# Patient Record
Sex: Female | Born: 1975 | Race: White | Hispanic: No | Marital: Married | Smoking: Never smoker
Health system: Southern US, Community
[De-identification: ages and names within clinical notes are randomized; demographics above are authoritative.]

## PROBLEM LIST (undated history)

## (undated) DIAGNOSIS — O09529 Supervision of elderly multigravida, unspecified trimester: Secondary | ICD-10-CM

## (undated) DIAGNOSIS — Z8619 Personal history of other infectious and parasitic diseases: Secondary | ICD-10-CM

## (undated) DIAGNOSIS — J302 Other seasonal allergic rhinitis: Secondary | ICD-10-CM

## (undated) DIAGNOSIS — E785 Hyperlipidemia, unspecified: Secondary | ICD-10-CM

## (undated) HISTORY — DX: Personal history of other infectious and parasitic diseases: Z86.19

## (undated) HISTORY — PX: AUGMENTATION MAMMAPLASTY: SUR837

## (undated) HISTORY — DX: Supervision of elderly multigravida, unspecified trimester: O09.529

## (undated) HISTORY — PX: DILATION AND CURETTAGE OF UTERUS: SHX78

---

## 1998-07-07 ENCOUNTER — Encounter: Admission: RE | Admit: 1998-07-07 | Discharge: 1998-07-07 | Payer: Self-pay | Admitting: *Deleted

## 1999-08-11 ENCOUNTER — Other Ambulatory Visit: Admission: RE | Admit: 1999-08-11 | Discharge: 1999-08-11 | Payer: Self-pay | Admitting: Obstetrics and Gynecology

## 1999-10-20 ENCOUNTER — Other Ambulatory Visit: Admission: RE | Admit: 1999-10-20 | Discharge: 1999-10-20 | Payer: Self-pay | Admitting: Obstetrics and Gynecology

## 2000-01-27 ENCOUNTER — Other Ambulatory Visit: Admission: RE | Admit: 2000-01-27 | Discharge: 2000-01-27 | Payer: Self-pay | Admitting: Obstetrics and Gynecology

## 2000-04-23 ENCOUNTER — Other Ambulatory Visit: Admission: RE | Admit: 2000-04-23 | Discharge: 2000-04-23 | Payer: Self-pay | Admitting: Obstetrics and Gynecology

## 2001-05-06 ENCOUNTER — Other Ambulatory Visit: Admission: RE | Admit: 2001-05-06 | Discharge: 2001-05-06 | Payer: Self-pay | Admitting: Obstetrics and Gynecology

## 2002-01-22 ENCOUNTER — Inpatient Hospital Stay (HOSPITAL_COMMUNITY): Admission: AD | Admit: 2002-01-22 | Discharge: 2002-01-25 | Payer: Self-pay | Admitting: Obstetrics & Gynecology

## 2002-05-29 ENCOUNTER — Other Ambulatory Visit: Admission: RE | Admit: 2002-05-29 | Discharge: 2002-05-29 | Payer: Self-pay | Admitting: Obstetrics and Gynecology

## 2003-06-30 ENCOUNTER — Other Ambulatory Visit: Admission: RE | Admit: 2003-06-30 | Discharge: 2003-06-30 | Payer: Self-pay | Admitting: Obstetrics and Gynecology

## 2004-07-05 ENCOUNTER — Other Ambulatory Visit: Admission: RE | Admit: 2004-07-05 | Discharge: 2004-07-05 | Payer: Self-pay | Admitting: Obstetrics and Gynecology

## 2005-02-03 ENCOUNTER — Ambulatory Visit (HOSPITAL_COMMUNITY): Admission: RE | Admit: 2005-02-03 | Discharge: 2005-02-03 | Payer: Self-pay | Admitting: Obstetrics and Gynecology

## 2005-12-22 ENCOUNTER — Inpatient Hospital Stay (HOSPITAL_COMMUNITY): Admission: AD | Admit: 2005-12-22 | Discharge: 2005-12-24 | Payer: Self-pay | Admitting: Obstetrics and Gynecology

## 2011-03-23 ENCOUNTER — Other Ambulatory Visit: Payer: Self-pay | Admitting: Obstetrics and Gynecology

## 2014-06-18 ENCOUNTER — Encounter: Payer: Self-pay | Admitting: Emergency Medicine

## 2014-06-18 ENCOUNTER — Emergency Department
Admission: EM | Admit: 2014-06-18 | Discharge: 2014-06-18 | Disposition: A | Discharge status: Home / Self Care | Payer: BC Managed Care – PPO | Source: Home / Self Care | Attending: Emergency Medicine | Admitting: Emergency Medicine

## 2014-06-18 DIAGNOSIS — N3000 Acute cystitis without hematuria: Secondary | ICD-10-CM

## 2014-06-18 HISTORY — DX: Hyperlipidemia, unspecified: E78.5

## 2014-06-18 HISTORY — DX: Other seasonal allergic rhinitis: J30.2

## 2014-06-18 LAB — POCT URINALYSIS DIP (MANUAL ENTRY)
Bilirubin, UA: NEGATIVE
Blood, UA: NEGATIVE
Glucose, UA: NEGATIVE
Ketones, POC UA: NEGATIVE
Nitrite, UA: NEGATIVE
Protein Ur, POC: NEGATIVE
Spec Grav, UA: 1.01 (ref 1.005–1.03)
Urobilinogen, UA: 0.2 (ref 0–1)
pH, UA: 6.5 (ref 5–8)

## 2014-06-18 MED ORDER — CIPROFLOXACIN HCL 500 MG PO TABS: 500.0000 mg | ORAL_TABLET | Freq: Two times a day (BID) | ORAL | Status: DC

## 2014-06-18 NOTE — ED Provider Notes (Signed)
CSN: [id number]     Arrival date & time 06/18/14  1734 History   First MD Initiated Contact with Patient 06/18/14 1736     Chief Complaint  Patient presents with  . Dysuria    HPI This is a 38 y.o. female who presents today with UTI symptoms for 4 days.  + dysuria + frequency + urgency No hematuria No vaginal discharge No fever/chills No lower abdominal pain No nausea No vomiting No back pain No fatigue She denies chance of pregnancy.--- On Jearld AdjutantMarina IUD Has tried over-the-counter measures, such as Azo, without improvement.   Past Medical History  Diagnosis Date  . Seasonal allergies   . Hyperlipidemia    History reviewed. No pertinent past surgical history. History reviewed. No pertinent family history. History  Substance Use Topics  . Smoking status: Never Smoker   . Smokeless tobacco: Not on file  . Alcohol Use: Yes   OB History   Grav Para Term Preterm Abortions TAB SAB Ect Mult Living                 Review of Systems  All other systems reviewed and are negative.   Allergies  Review of patient's allergies indicates no known allergies.  Home Medications   Prior to Admission medications   Medication Sig Start Date End Date Taking? Authorizing Provider  fexofenadine (ALLEGRA) 180 MG tablet Take 180 mg by mouth daily.   Yes Historical Provider, MD  levonorgestrel (MIRENA) 20 MCG/24HR IUD 1 each by Intrauterine route once.   Yes Historical Provider, MD  ciprofloxacin (CIPRO) 500 MG tablet Take 1 tablet (500 mg total) by mouth 2 (two) times daily. For 7 days 06/18/14   Lajean Manesavid Massey, MD   BP 113/69  Pulse 66  Temp(Src) 98.4 F (36.9 C) (Oral)  Resp 16  Ht 5\' 4"  (1.626 m)  Wt 140 lb (63.504 kg)  BMI 24.02 kg/m2  SpO2 100% Physical Exam  Nursing note and vitals reviewed. Constitutional: She is oriented to person, place, and time. She appears well-developed and well-nourished. No distress.  HENT:  Mouth/Throat: Oropharynx is clear and moist.  Eyes: No  scleral icterus.  Neck: Neck supple.  Cardiovascular: Normal rate, regular rhythm and normal heart sounds.   Pulmonary/Chest: Breath sounds normal.  Abdominal: Soft. She exhibits no mass. There is no hepatosplenomegaly. There is tenderness in the suprapubic area. There is no rebound, no guarding and no CVA tenderness.  Lymphadenopathy:    She has no cervical adenopathy.  Neurological: She is alert and oriented to person, place, and time.  Skin: Skin is warm and dry.    ED Course  Procedures (including critical care time) Labs Review Labs Reviewed  POCT URINALYSIS DIP (MANUAL ENTRY)    Imaging Review No results found.   MDM   1. Acute cystitis without hematuria     urinalysis + for leukocytes  Treatment options discussed, as well as risks, benefits, alternatives. Patient voiced understanding and agreement with the following plans: Urine culture Cipro 500 twice a day x7 days Other symptomatic care device discussed  Follow-up with your primary care doctor in 5-7 days if not improving, or sooner if symptoms become worse. Precautions discussed. Red flags discussed. Questions invited and answered. Patient voiced understanding and agreement.    Lajean Manesavid Massey, MD 06/18/14 [phone]

## 2014-06-18 NOTE — ED Notes (Signed)
Has experienced varying degrees of dysuria x 4 days; took AZO earlier in week; has just returned from vacation in [place] 5 days ago.

## 2014-06-20 LAB — URINE CULTURE: Colony Count: >=100000

## 2014-06-25 ENCOUNTER — Telehealth: Payer: Self-pay

## 2014-06-25 NOTE — ED Notes (Signed)
Left detailed message advising patient to finish antibiotic and to follow up with PCP.

## 2014-09-01 ENCOUNTER — Other Ambulatory Visit: Payer: Self-pay | Admitting: Obstetrics and Gynecology

## 2014-09-02 LAB — CYTOLOGY - PAP

## 2015-09-15 ENCOUNTER — Other Ambulatory Visit: Payer: Self-pay | Admitting: Obstetrics and Gynecology

## 2015-09-15 LAB — OB RESULTS CONSOLE HEPATITIS B SURFACE ANTIGEN: HEP B S AG: NEGATIVE

## 2015-09-15 LAB — OB RESULTS CONSOLE GC/CHLAMYDIA
Chlamydia: NEGATIVE
Gonorrhea: NEGATIVE

## 2015-09-15 LAB — OB RESULTS CONSOLE ABO/RH: RH TYPE: POSITIVE

## 2015-09-15 LAB — OB RESULTS CONSOLE RPR: RPR: NONREACTIVE

## 2015-09-15 LAB — OB RESULTS CONSOLE ANTIBODY SCREEN: ANTIBODY SCREEN: NEGATIVE

## 2015-09-15 LAB — OB RESULTS CONSOLE HIV ANTIBODY (ROUTINE TESTING): HIV: NONREACTIVE

## 2015-09-15 LAB — OB RESULTS CONSOLE RUBELLA ANTIBODY, IGM: Rubella: IMMUNE

## 2015-09-16 LAB — CYTOLOGY - PAP

## 2015-11-14 NOTE — L&D Delivery Note (Signed)
Delivery Note  Patient pushed for one hour after she was noted to be C/C/+2. At 5:36 PM a viable and healthy female was delivered via Vaginal, Spontaneous Delivery (Presentation: ; Right Occiput Posterior).  APGAR: 8, 9; weight pending. Baby laid on maternal abdomen after birth.  Placenta was manually delivered as cord avulsed and it was noted to be adherent to the anterior uterine wall. A uterine sweep was performed after removal, however will get a pelvic US to ensure no remnant remains.  Cord: 3 vessels with the following complications: None.  Minimal bleeding.  Second degree laceration repaired in routine fashion with 2-0 Vicryl and 3-0 chromic  Anesthesia: Epidural  Episiotomy:   Lacerations: 2nd degree Suture Repair: 2.0 vicryl Est. Blood Loss (mL):  300  Mom to postpartum.  Baby to Couplet care / Skin to Skin.  Nancy Lucas, Nancy Lucas 03/31/2016, 6:22 PM

## 2016-03-09 ENCOUNTER — Other Ambulatory Visit: Payer: Self-pay | Admitting: Obstetrics and Gynecology

## 2016-03-09 LAB — OB RESULTS CONSOLE GBS: GBS: POSITIVE

## 2016-03-17 ENCOUNTER — Other Ambulatory Visit: Payer: Self-pay | Admitting: Obstetrics and Gynecology

## 2016-03-27 ENCOUNTER — Telehealth (HOSPITAL_COMMUNITY): Payer: Self-pay | Admitting: *Deleted

## 2016-03-27 ENCOUNTER — Encounter (HOSPITAL_COMMUNITY): Payer: Self-pay | Admitting: *Deleted

## 2016-03-27 NOTE — Telephone Encounter (Signed)
Preadmission screen  

## 2016-03-31 ENCOUNTER — Inpatient Hospital Stay (HOSPITAL_COMMUNITY): Payer: BLUE CROSS/BLUE SHIELD | Admitting: Anesthesiology

## 2016-03-31 ENCOUNTER — Encounter (HOSPITAL_COMMUNITY): Payer: Self-pay

## 2016-03-31 ENCOUNTER — Inpatient Hospital Stay (HOSPITAL_COMMUNITY)
Admission: RE | Admit: 2016-03-31 | Discharge: 2016-04-02 | DRG: 775 | Disposition: A | Discharge status: Home / Self Care | Payer: BLUE CROSS/BLUE SHIELD | Source: Ambulatory Visit | Attending: Obstetrics & Gynecology | Admitting: Obstetrics & Gynecology

## 2016-03-31 ENCOUNTER — Inpatient Hospital Stay (HOSPITAL_COMMUNITY): Payer: BLUE CROSS/BLUE SHIELD

## 2016-03-31 DIAGNOSIS — O99824 Streptococcus B carrier state complicating childbirth: Secondary | ICD-10-CM | POA: Diagnosis present

## 2016-03-31 DIAGNOSIS — Z3A39 39 weeks gestation of pregnancy: Secondary | ICD-10-CM

## 2016-03-31 DIAGNOSIS — Z3490 Encounter for supervision of normal pregnancy, unspecified, unspecified trimester: Secondary | ICD-10-CM

## 2016-03-31 DIAGNOSIS — E669 Obesity, unspecified: Secondary | ICD-10-CM | POA: Diagnosis present

## 2016-03-31 DIAGNOSIS — Z6831 Body mass index (BMI) 31.0-31.9, adult: Secondary | ICD-10-CM | POA: Diagnosis not present

## 2016-03-31 DIAGNOSIS — O99214 Obesity complicating childbirth: Secondary | ICD-10-CM | POA: Diagnosis present

## 2016-03-31 LAB — CBC
HCT: 26.4 % — ABNORMAL LOW (ref 36.0–46.0)
HEMOGLOBIN: 8.5 g/dL — AB (ref 12.0–15.0)
MCH: 27.2 pg (ref 26.0–34.0)
MCHC: 32.2 g/dL (ref 30.0–36.0)
MCV: 84.3 fL (ref 78.0–100.0)
Platelets: 210 10*3/uL (ref 150–400)
RBC: 3.13 MIL/uL — AB (ref 3.87–5.11)
RDW: 13.7 % (ref 11.5–15.5)
WBC: 8.6 10*3/uL (ref 4.0–10.5)

## 2016-03-31 LAB — TYPE AND SCREEN
ABO/RH(D): AB POS
ANTIBODY SCREEN: NEGATIVE

## 2016-03-31 LAB — ABO/RH: ABO/RH(D): AB POS

## 2016-03-31 MED ORDER — FENTANYL 2.5 MCG/ML BUPIVACAINE 1/10 % EPIDURAL INFUSION (WH - ANES)
14.0000 mL/h | INTRAMUSCULAR | Status: DC | PRN
  Administered 2016-03-31 (×2): 14 mL/h via EPIDURAL
  Filled 2016-03-31 (×2): qty 125

## 2016-03-31 MED ORDER — ONDANSETRON HCL 4 MG PO TABS: 4.0000 mg | ORAL_TABLET | ORAL | Status: DC | PRN

## 2016-03-31 MED ORDER — DIPHENHYDRAMINE HCL 25 MG PO CAPS: 25.0000 mg | ORAL_CAPSULE | Freq: Four times a day (QID) | ORAL | Status: DC | PRN

## 2016-03-31 MED ORDER — OXYTOCIN 40 UNITS IN LACTATED RINGERS INFUSION - SIMPLE MED: 2.5000 [IU]/h | INTRAVENOUS | Status: DC | PRN

## 2016-03-31 MED ORDER — SODIUM CHLORIDE 0.9 % IV SOLN
3.0000 g | Freq: Once | INTRAVENOUS | Status: AC
  Administered 2016-03-31: 3 g via INTRAVENOUS
  Filled 2016-03-31: qty 3

## 2016-03-31 MED ORDER — TERBUTALINE SULFATE 1 MG/ML IJ SOLN
0.2500 mg | Freq: Once | INTRAMUSCULAR | Status: DC | PRN
  Filled 2016-03-31: qty 1

## 2016-03-31 MED ORDER — ONDANSETRON HCL 4 MG/2ML IJ SOLN
4.0000 mg | Freq: Four times a day (QID) | INTRAMUSCULAR | Status: DC | PRN
  Administered 2016-03-31: 4 mg via INTRAVENOUS
  Filled 2016-03-31: qty 2

## 2016-03-31 MED ORDER — IBUPROFEN 600 MG PO TABS
600.0000 mg | ORAL_TABLET | Freq: Four times a day (QID) | ORAL | Status: DC
  Administered 2016-03-31 – 2016-04-02 (×7): 600 mg via ORAL
  Filled 2016-03-31 (×7): qty 1

## 2016-03-31 MED ORDER — OXYTOCIN 40 UNITS IN LACTATED RINGERS INFUSION - SIMPLE MED
2.5000 [IU]/h | INTRAVENOUS | Status: DC
  Filled 2016-03-31: qty 1000

## 2016-03-31 MED ORDER — OXYTOCIN 40 UNITS IN LACTATED RINGERS INFUSION - SIMPLE MED
1.0000 m[IU]/min | INTRAVENOUS | Status: DC
  Administered 2016-03-31: 2 m[IU]/min via INTRAVENOUS

## 2016-03-31 MED ORDER — DIPHENHYDRAMINE HCL 50 MG/ML IJ SOLN: 12.5000 mg | INTRAMUSCULAR | Status: DC | PRN

## 2016-03-31 MED ORDER — PHENYLEPHRINE 40 MCG/ML (10ML) SYRINGE FOR IV PUSH (FOR BLOOD PRESSURE SUPPORT)
80.0000 ug | PREFILLED_SYRINGE | INTRAVENOUS | Status: DC | PRN
  Filled 2016-03-31: qty 10
  Filled 2016-03-31: qty 5

## 2016-03-31 MED ORDER — ACETAMINOPHEN 325 MG PO TABS: 650.0000 mg | ORAL_TABLET | ORAL | Status: DC | PRN

## 2016-03-31 MED ORDER — OXYTOCIN BOLUS FROM INFUSION
500.0000 mL | INTRAVENOUS | Status: DC
  Administered 2016-03-31: 500 mL via INTRAVENOUS

## 2016-03-31 MED ORDER — EPHEDRINE 5 MG/ML INJ
10.0000 mg | INTRAVENOUS | Status: DC | PRN
  Filled 2016-03-31: qty 2

## 2016-03-31 MED ORDER — ACETAMINOPHEN 325 MG PO TABS
650.0000 mg | ORAL_TABLET | ORAL | Status: DC | PRN
  Administered 2016-04-01: 650 mg via ORAL
  Filled 2016-03-31: qty 2

## 2016-03-31 MED ORDER — SENNOSIDES-DOCUSATE SODIUM 8.6-50 MG PO TABS
2.0000 | ORAL_TABLET | ORAL | Status: DC
  Administered 2016-04-01 (×2): 2 via ORAL
  Filled 2016-03-31 (×2): qty 2

## 2016-03-31 MED ORDER — ZOLPIDEM TARTRATE 5 MG PO TABS: 5.0000 mg | ORAL_TABLET | Freq: Every evening | ORAL | Status: DC | PRN

## 2016-03-31 MED ORDER — WITCH HAZEL-GLYCERIN EX PADS: 1.0000 "application " | MEDICATED_PAD | CUTANEOUS | Status: DC | PRN

## 2016-03-31 MED ORDER — TETANUS-DIPHTH-ACELL PERTUSSIS 5-2.5-18.5 LF-MCG/0.5 IM SUSP: 0.5000 mL | Freq: Once | INTRAMUSCULAR | Status: DC

## 2016-03-31 MED ORDER — BENZOCAINE-MENTHOL 20-0.5 % EX AERO
1.0000 "application " | INHALATION_SPRAY | CUTANEOUS | Status: DC | PRN
  Filled 2016-03-31: qty 56

## 2016-03-31 MED ORDER — MISOPROSTOL 25 MCG QUARTER TABLET
25.0000 ug | ORAL_TABLET | ORAL | Status: DC | PRN
  Administered 2016-03-31: 25 ug via VAGINAL
  Filled 2016-03-31: qty 1
  Filled 2016-03-31 (×3): qty 0.25

## 2016-03-31 MED ORDER — PENICILLIN G POTASSIUM 5000000 UNITS IJ SOLR
5.0000 10*6.[IU] | Freq: Once | INTRAVENOUS | Status: AC
  Administered 2016-03-31: 5 10*6.[IU] via INTRAVENOUS
  Filled 2016-03-31: qty 5

## 2016-03-31 MED ORDER — LACTATED RINGERS IV SOLN: 500.0000 mL | Freq: Once | INTRAVENOUS | Status: DC

## 2016-03-31 MED ORDER — OXYCODONE-ACETAMINOPHEN 5-325 MG PO TABS: 2.0000 | ORAL_TABLET | ORAL | Status: DC | PRN

## 2016-03-31 MED ORDER — CITRIC ACID-SODIUM CITRATE 334-500 MG/5ML PO SOLN: 30.0000 mL | ORAL | Status: DC | PRN

## 2016-03-31 MED ORDER — LACTATED RINGERS IV SOLN
500.0000 mL | INTRAVENOUS | Status: DC | PRN
  Administered 2016-03-31: 500 mL via INTRAVENOUS

## 2016-03-31 MED ORDER — ONDANSETRON HCL 4 MG/2ML IJ SOLN: 4.0000 mg | INTRAMUSCULAR | Status: DC | PRN

## 2016-03-31 MED ORDER — OXYCODONE-ACETAMINOPHEN 5-325 MG PO TABS: 1.0000 | ORAL_TABLET | ORAL | Status: DC | PRN

## 2016-03-31 MED ORDER — LIDOCAINE HCL (PF) 1 % IJ SOLN
INTRAMUSCULAR | Status: DC | PRN
  Administered 2016-03-31 (×2): 4 mL

## 2016-03-31 MED ORDER — PENICILLIN G POTASSIUM 5000000 UNITS IJ SOLR
2.5000 10*6.[IU] | INTRAVENOUS | Status: DC
  Administered 2016-03-31 (×2): 2.5 10*6.[IU] via INTRAVENOUS
  Filled 2016-03-31 (×9): qty 2.5

## 2016-03-31 MED ORDER — PRENATAL MULTIVITAMIN CH
1.0000 | ORAL_TABLET | Freq: Every day | ORAL | Status: DC
  Administered 2016-04-01: 1 via ORAL
  Filled 2016-03-31: qty 1

## 2016-03-31 MED ORDER — COCONUT OIL OIL: 1.0000 "application " | TOPICAL_OIL | Status: DC | PRN

## 2016-03-31 MED ORDER — SIMETHICONE 80 MG PO CHEW: 80.0000 mg | CHEWABLE_TABLET | ORAL | Status: DC | PRN

## 2016-03-31 MED ORDER — LIDOCAINE HCL (PF) 1 % IJ SOLN
30.0000 mL | INTRAMUSCULAR | Status: DC | PRN
  Filled 2016-03-31: qty 30

## 2016-03-31 MED ORDER — LACTATED RINGERS IV SOLN
INTRAVENOUS | Status: DC
  Administered 2016-03-31: 125 mL/h via INTRAVENOUS
  Administered 2016-03-31: 01:00:00 via INTRAVENOUS

## 2016-03-31 MED ORDER — PHENYLEPHRINE 40 MCG/ML (10ML) SYRINGE FOR IV PUSH (FOR BLOOD PRESSURE SUPPORT)
80.0000 ug | PREFILLED_SYRINGE | INTRAVENOUS | Status: DC | PRN
  Filled 2016-03-31: qty 5

## 2016-03-31 MED ORDER — DIBUCAINE 1 % RE OINT
1.0000 "application " | TOPICAL_OINTMENT | RECTAL | Status: DC | PRN
  Filled 2016-03-31: qty 28

## 2016-03-31 NOTE — Anesthesia Preprocedure Evaluation (Signed)

## 2016-03-31 NOTE — Anesthesia Pain Management Evaluation Note (Signed)
  CRNA Pain Management Visit Note  Patient: Nancy Lucas, 40 y.o., female  "Hello I am a member of the anesthesia team at [facility]. We have an anesthesia team available at all times to provide care throughout the hospital, including epidural management and anesthesia for C-section. I don't know your plan for the delivery whether it a natural birth, water birth, IV sedation, nitrous supplementation, doula or epidural, but we want to meet your pain goals."   1.Was your pain managed to your expectations on prior hospitalizations?   Yes   2.What is your expectation for pain management during this hospitalization?     Epidural  3.How can we help you reach that goal? epidural  Record the patient's initial score and the patient's pain goal.   Pain: 0  Pain Goal: 4 [facility] wants you to be able to say your pain was always managed very well.  Vickee Mormino 03/31/2016

## 2016-03-31 NOTE — Progress Notes (Signed)
Juluis MireCherie Victory is a 40 y.o. [id number] at 3767w0d by LMP admitted for induction of labor due to Elective at term.  Subjective: Patient comfortable overall with epidural.  She is having some back pain Objective: BP 141/89 mmHg  Pulse 73  Temp(Src) 97.9 F (36.6 C) (Oral)  Resp 16  Ht 5\' 4"  (1.626 m)  Wt 81.647 kg (180 lb)  BMI 30.88 kg/m2  SpO2 99%  LMP 07/02/2015      FHT:  FHR: 145 bpm, variability: moderate,  accelerations:  Present,  decelerations:  Absent UC:   irregular, every 4-5 minutes SVE:   Dilation: 4 Effacement (%): 80 Station: -1 Exam by:: Dr, Mora ApplPinn  Labs: Lab Results  Component Value Date   WBC 8.6 03/31/2016   HGB 8.5* 03/31/2016   HCT 26.4* 03/31/2016   MCV 84.3 03/31/2016   PLT 210 03/31/2016    Assessment / Plan: Induction of labor due to AMA elective at term,  progressing well on pitocin  Labor: AROM performed clear moderate Preeclampsia:  no signs or symptoms of toxicity Fetal Wellbeing:  Category I Pain Control:  Epidural I/D:  n/a Anticipated MOD:  NSVD  Prarthana Parlin [name] 03/31/2016, 12:36 PM

## 2016-03-31 NOTE — H&P (Signed)
Nancy Lucas is a 40 y.o. female G4P2 AMA presenting for Elective IOL at term. Maternal Medical History:  Reason for admission: Contractions.  Nausea. Elective IOL at term  Contractions: Onset was 1-2 hours ago.   Frequency: irregular.   Duration is approximately 60 seconds.   Perceived severity is mild.    Fetal activity: Perceived fetal activity is normal.   Last perceived fetal movement was within the past 12 hours.    Prenatal complications: no prenatal complications   OB History    Gravida Para Term Preterm AB TAB SAB Ectopic Multiple Living   Past Medical History  Diagnosis Date  . Seasonal allergies   . Hyperlipidemia   . Hx of varicella   . AMA (advanced maternal age) multigravida 35+    Past Surgical History  Procedure Laterality Date  . Dilation and curettage of uterus     Family History: family history includes Hyperlipidemia in her maternal grandfather and mother. Social History:  reports that she has never smoked. She has never used smokeless tobacco. She reports that she drinks alcohol. She reports that she does not use illicit drugs.   Prenatal Transfer Tool  Maternal Diabetes: No Genetic Screening: Normal Maternal Ultrasounds/Referrals: Normal Fetal Ultrasounds or other Referrals:  Other: Anatomy scan normal Maternal Substance Abuse:  No Significant Maternal Medications:  None Significant Maternal Lab Results:  Lab values include: Group B Strep positive Other Comments:  None  Review of Systems  Constitutional: Negative for fever and chills.  HENT: Negative for hearing loss.   Eyes: Negative for blurred vision and double vision.  Respiratory: Negative for cough.   Cardiovascular: Negative for chest pain and palpitations.  Gastrointestinal: Positive for heartburn. Negative for nausea and vomiting.  Genitourinary: Negative for dysuria and urgency.  Musculoskeletal: Negative for myalgias.  Skin: Negative for itching and rash.   Neurological: Negative for headaches.  Endo/Heme/Allergies: Negative for environmental allergies. Does not bruise/bleed easily.  Psychiatric/Behavioral: Negative for depression and suicidal ideas.  All other systems reviewed and are negative.   Dilation: 2 Effacement (%): Thick Station: -3 Exam by:: D Jasso, RN Blood pressure 125/71, pulse 74, temperature 98.2 F (36.8 C), temperature source Oral, resp. rate 16, height  (1.626 m), weight 81.647 kg (180 lb), last menstrual period 07/02/2015. Maternal Exam:  Uterine Assessment: Contraction strength is mild.  Contraction duration is 60 seconds. Contraction frequency is irregular.   Abdomen: Patient reports no abdominal tenderness. Fundal height is 39 cm.   Estimated fetal weight is 2900 grams.   Fetal presentation: vertex  Introitus: Normal vulva. Normal vagina.  Ferning test: not done.  Nitrazine test: not done. Amniotic fluid character: not assessed.  Pelvis: adequate for delivery.   Cervix: Cervix evaluated by digital exam.   1-2 /50/-3  Fetal Exam Fetal Monitor Review: Mode: hand-held doppler probe.   Baseline rate: 135.  Variability: moderate (6-25 bpm).   Pattern: accelerations present and no decelerations.    Fetal State Assessment: Category I - tracings are normal.     Physical Exam  Vitals reviewed. Constitutional: She is oriented to person, place, and time. She appears well-developed and well-nourished.  HENT:  Head: Normocephalic and atraumatic.  Neck: Normal range of motion.  Cardiovascular: Normal rate and regular rhythm.   Respiratory: Effort normal.  GI: Soft.  Genitourinary: Vagina normal.  Musculoskeletal: Normal range of motion.  Neurological: She is alert and oriented to person, place, and  time. She has normal reflexes.  Skin: Skin is warm.    Prenatal labs: ABO, Rh: --/--/AB POS, AB POS (05/19 0110) Antibody: NEG (05/19 0110) Rubella: Immune (11/02 0000) RPR: Nonreactive (11/02 0000)   HBsAg: Negative (11/02 0000)  HIV: Non-reactive (11/02 0000)  GBS: Positive (04/27 0000)   Assessment/Plan: 40 yo G4P2 at 39 weeks for elective IOL Misoprostol, then pitocin AROM Epidural on demand PCN for GBS status Continuous EFM / TOCO   Nancy Lucas Nancy Lucas 03/31/2016, 8:20 AM

## 2016-03-31 NOTE — Anesthesia Procedure Notes (Signed)
Epidural Patient location during procedure: OB  Staffing Anesthesiologist: Iya Hamed Performed by: anesthesiologist   Preanesthetic Checklist Completed: patient identified, site marked, surgical consent, pre-op evaluation, timeout performed, IV checked, risks and benefits discussed and monitors and equipment checked  Epidural Patient position: sitting Prep: site prepped and draped and DuraPrep Patient monitoring: continuous pulse ox and blood pressure Approach: midline Location: L3-L4 Injection technique: LOR saline  Needle:  Needle type: Tuohy  Needle gauge: 17 G Needle length: 9 cm and 9 Needle insertion depth: 6 cm Catheter type: closed end flexible Catheter size: 19 Gauge Catheter at skin depth: 10 cm Test dose: negative  Assessment Events: blood not aspirated, injection not painful, no injection resistance, negative IV test and no paresthesia  Additional Notes Patient identified. Risks/Benefits/Options discussed with patient including but not limited to bleeding, infection, nerve damage, paralysis, failed block, incomplete pain control, headache, blood pressure changes, nausea, vomiting, reactions to medication both or allergic, itching and postpartum back pain. Confirmed with bedside nurse the patient's most recent platelet count. Confirmed with patient that they are not currently taking any anticoagulation, have any bleeding history or any family history of bleeding disorders. Patient expressed understanding and wished to proceed. All questions were answered. Sterile technique was used throughout the entire procedure. Please see nursing notes for vital signs. Test dose was given through epidural catheter and negative prior to continuing to dose epidural or start infusion. Warning signs of high block given to the patient including shortness of breath, tingling/numbness in hands, complete motor block, or any concerning symptoms with instructions to call for help. Patient was  given instructions on fall risk and not to get out of bed. All questions and concerns addressed with instructions to call with any issues or inadequate analgesia.    

## 2016-03-31 NOTE — Progress Notes (Signed)
Nancy Lucas is a 40 y.o. [id number] at 4166w0d by LMP admitted for induction of labor due to Elective at term.  Subjective: Patient feeling more pressure  Objective: BP 132/73 mmHg  Pulse 77  Temp(Src) 97.9 F (36.6 C) (Oral)  Resp 16  Ht 5\' 4"  (1.626 m)  Wt 81.647 kg (180 lb)  BMI 30.88 kg/m2  SpO2 99%  LMP 07/02/2015   Total I/O In: -  Out: 725 [Urine:725]  FHT:  FHR: 130 bpm, variability: moderate,  accelerations:  Present,  decelerations:  Present early variables with ctx UC:   regular, every 1-2 minutes SVE:   Dilation: 9 Effacement (%): 100 Station: 0 Exam by:: MD Jama Mcmiller  Labs: Lab Results  Component Value Date   WBC 8.6 03/31/2016   HGB 8.5* 03/31/2016   HCT 26.4* 03/31/2016   MCV 84.3 03/31/2016   PLT 210 03/31/2016    Assessment / Plan: Induction of labor due to AMA at term,  progressing well on pitocin Now almost in second stage  Labor: Progressing normally Preeclampsia:  no signs or symptoms of toxicity Fetal Wellbeing:  Category I Pain Control:  Epidural I/D:  n/a Anticipated MOD:  NSVD  Yarelis Ambrosino [name] 03/31/2016, 3:11 PM

## 2016-04-01 ENCOUNTER — Inpatient Hospital Stay (HOSPITAL_COMMUNITY): Payer: BLUE CROSS/BLUE SHIELD | Admitting: Anesthesiology

## 2016-04-01 ENCOUNTER — Encounter (HOSPITAL_COMMUNITY)
Admission: RE | Disposition: A | Discharge status: Home / Self Care | Payer: Self-pay | Source: Ambulatory Visit | Attending: Obstetrics & Gynecology

## 2016-04-01 LAB — CBC
HEMATOCRIT: 24.4 % — AB (ref 36.0–46.0)
Hemoglobin: 7.9 g/dL — ABNORMAL LOW (ref 12.0–15.0)
MCH: 27.4 pg (ref 26.0–34.0)
MCHC: 32.4 g/dL (ref 30.0–36.0)
MCV: 84.7 fL (ref 78.0–100.0)
PLATELETS: 177 10*3/uL (ref 150–400)
RBC: 2.88 MIL/uL — AB (ref 3.87–5.11)
RDW: 13.4 % (ref 11.5–15.5)
WBC: 12.4 10*3/uL — ABNORMAL HIGH (ref 4.0–10.5)

## 2016-04-01 LAB — SURGICAL PCR SCREEN
MRSA, PCR: NEGATIVE
Staphylococcus aureus: POSITIVE — AB

## 2016-04-01 LAB — RPR: RPR Ser Ql: NONREACTIVE

## 2016-04-01 SURGERY — LIGATION, FALLOPIAN TUBE, POSTPARTUM
Anesthesia: Epidural | Laterality: Bilateral

## 2016-04-01 MED ORDER — LACTATED RINGERS IV SOLN
INTRAVENOUS | Status: DC
  Administered 2016-04-01: 20 mL/h via INTRAVENOUS

## 2016-04-01 MED ORDER — FAMOTIDINE 20 MG PO TABS
40.0000 mg | ORAL_TABLET | Freq: Once | ORAL | Status: AC
  Administered 2016-04-01: 40 mg via ORAL
  Filled 2016-04-01 (×2): qty 2

## 2016-04-01 MED ORDER — MUPIROCIN 2 % EX OINT
1.0000 | TOPICAL_OINTMENT | Freq: Two times a day (BID) | CUTANEOUS | Status: DC
Start: 2016-04-01 — End: 2016-04-02
  Administered 2016-04-01 – 2016-04-02 (×2): 1 via NASAL
  Filled 2016-04-01 (×2): qty 22

## 2016-04-01 MED ORDER — CHLORHEXIDINE GLUCONATE CLOTH 2 % EX PADS
6.0000 | MEDICATED_PAD | Freq: Every day | CUTANEOUS | Status: DC
Start: 2016-04-01 — End: 2016-04-02

## 2016-04-01 MED ORDER — METOCLOPRAMIDE HCL 10 MG PO TABS
10.0000 mg | ORAL_TABLET | Freq: Once | ORAL | Status: AC
  Administered 2016-04-01: 10 mg via ORAL
  Filled 2016-04-01: qty 1

## 2016-04-01 NOTE — Anesthesia Preprocedure Evaluation (Deleted)
Anesthesia Evaluation  Patient identified by MRN, date of birth, ID band Patient awake    Reviewed: Allergy & Precautions, NPO status , Patient's Chart, lab work & pertinent test results  History of Anesthesia Complications Negative for: history of anesthetic complications  Airway Mallampati: II  TM Distance: >3 FB Neck ROM: Full    Dental no notable dental hx. (+) Dental Advisory Given   Pulmonary neg pulmonary ROS,    Pulmonary exam normal breath sounds clear to auscultation       Cardiovascular negative cardio ROS Normal cardiovascular exam Rhythm:Regular Rate:Normal     Neuro/Psych negative neurological ROS  negative psych ROS   GI/Hepatic negative GI ROS, Neg liver ROS,   Endo/Other  obesity  Renal/GU negative Renal ROS  negative genitourinary   Musculoskeletal negative musculoskeletal ROS (+)   Abdominal   Peds negative pediatric ROS (+)  Hematology negative hematology ROS (+)   Anesthesia Other Findings   Reproductive/Obstetrics Desires sterilization                             Anesthesia Physical  Anesthesia Plan  ASA: II  Anesthesia Plan: Epidural   Post-op Pain Management:    Induction:   Airway Management Planned: Natural Airway and Nasal Cannula  Additional Equipment:   Intra-op Plan:   Post-operative Plan:   Informed Consent: I have reviewed the patients History and Physical, chart, labs and discussed the procedure including the risks, benefits and alternatives for the proposed anesthesia with the patient or authorized representative who has indicated his/her understanding and acceptance.   Dental advisory given  Plan Discussed with: CRNA, Surgeon and Anesthesiologist  Anesthesia Plan Comments: (Plan to use epidural in place for PPTL. Helmut MusterM. Hermon Zea, MD)        Anesthesia Quick Evaluation

## 2016-04-01 NOTE — Progress Notes (Signed)
Post Partum Day 1 Subjective: Patient with some cramping, no heavy bleeding.  Bottle feeding  Objective: Blood pressure 114/65, pulse 72, temperature 98.7 F (37.1 C), temperature source Oral, resp. rate 16, height 5\' 4"  (1.626 m), weight 81.647 kg (180 lb), last menstrual period 07/02/2015, SpO2 99 %,   Physical Exam:  General: alert, cooperative and no distress Lochia: appropriate Uterine Fundus: firm perineum: healing well, no significant drainage, no dehiscence DVT Evaluation: No evidence of DVT seen on physical exam. Negative Homan's sign. No cords or calf tenderness. Calf/Ankle edema is present.   Recent Labs  03/31/16 0110 04/01/16 0519  HGB 8.5* 7.9*  HCT 26.4* 24.4*    Assessment/Plan: Patient was scheduled for Post partum tubal, however, with her Hb being Low decision was made to cancel it and discussed with patient doing an interval tubal.  Pelvic US was negative last night so D&C not needed.  Will watch her overnight and d/c in am Circumcision for baby boy this am   LOS: 1 day   Nancy Lucas [name] 04/01/2016, 7:42 AM

## 2016-04-01 NOTE — Anesthesia Postprocedure Evaluation (Signed)
Anesthesia Post Note  Patient: Nancy Lucas  Procedure(s) Performed: * No procedures listed *  Patient location during evaluation: Mother Baby Anesthesia Type: Epidural Level of consciousness: oriented and awake and alert Pain management: pain level controlled Vital Signs Assessment: post-procedure vital signs reviewed and stable Respiratory status: spontaneous breathing Cardiovascular status: stable Postop Assessment: epidural receding, patient able to bend at knees, no signs of nausea or vomiting and adequate PO intake Anesthetic complications: no     Last Vitals:  Filed Vitals:   04/01/16 0010 04/01/16 0545  BP: 132/69 114/65  Pulse: 84 72  Temp: 37.2 C 37.1 C  Resp: 16 16    Last Pain:  Filed Vitals:   04/01/16 0605  PainSc: 2    Pain Goal:                 Laban EmperorMalinova,Floyde Dingley Hristova

## 2016-04-01 NOTE — Progress Notes (Signed)
Surgical PCR Staph. Aureus positive - protocol initiated, Dr Mora ApplPinn notified.

## 2016-04-02 MED ORDER — FERROUS SULFATE 325 (65 FE) MG PO TABS: 325.0000 mg | ORAL_TABLET | Freq: Two times a day (BID) | ORAL | Status: DC

## 2016-04-02 MED ORDER — IBUPROFEN 600 MG PO TABS: 600.0000 mg | ORAL_TABLET | Freq: Four times a day (QID) | ORAL | Status: AC

## 2016-04-02 NOTE — Discharge Summary (Signed)
Obstetric Discharge Summary Reason for Admission: induction of labor Prenatal Procedures: NST Intrapartum Procedures: spontaneous vaginal delivery Postpartum Procedures: none Complications-Operative and Postpartum: 2nd degree perineal laceration HEMOGLOBIN  Date Value Ref Range Status  04/01/2016 7.9* 12.0 - 15.0 g/dL Final   HCT  Date Value Ref Range Status  04/01/2016 24.4* 36.0 - 46.0 % Final    Physical Exam:  General: alert, cooperative and no distress Lochia: appropriate Uterine Fundus: firm perineum: healing well, no significant drainage, no dehiscence DVT Evaluation: No evidence of DVT seen on physical exam. Negative Homan's sign. No cords or calf tenderness.  Discharge Diagnoses: Term Pregnancy-delivered  Discharge Information: Date: 04/02/2016 Activity: pelvic rest Diet: routine Medications: Ibuprofen, Colace and Iron Condition: stable Instructions: refer to practice specific booklet Discharge to: home   Newborn Data: Live born female  Birth Weight: 8 lb 8 oz (3855 g) APGAR: 8, 9  Home with mother.  Nancy HartINN, Nancy Lucas Nancy Lucas 04/02/2016, 9:48 AM

## 2016-04-02 NOTE — Progress Notes (Signed)
Post Partum Day 2 Subjective: no complaints, up ad lib, voiding, tolerating PO, + flatus and bottle feeding  Objective: Blood pressure 146/84, pulse 72, temperature 98 F (36.7 C), temperature source Oral, resp. rate 20, height 5\' 4"  (1.626 m), weight 81.647 kg (180 lb), last menstrual period 07/02/2015, SpO2 97 %, unknown if currently breastfeeding.  Physical Exam:  General: alert, cooperative and no distress Lochia: appropriate Uterine Fundus: firm perineum: healing well, no significant drainage, no dehiscence DVT Evaluation: No evidence of DVT seen on physical exam. Negative Homan's sign. No cords or calf tenderness.   Recent Labs  03/31/16 0110 04/01/16 0519  HGB 8.5* 7.9*  HCT 26.4* 24.4*    Assessment/Plan: Discharge home and Circumcision done prior to discharge Will have interval tubal scheduled in office.    LOS: 2 days   Nancy Lucas [name] 04/02/2016, 9:45 AM

## 2016-04-07 ENCOUNTER — Inpatient Hospital Stay (HOSPITAL_COMMUNITY): Admission: AD | Admit: 2016-04-07 | Payer: Self-pay | Source: Ambulatory Visit | Admitting: Obstetrics

## 2016-05-28 ENCOUNTER — Emergency Department
Admission: EM | Admit: 2016-05-28 | Discharge: 2016-05-28 | Disposition: A | Discharge status: Home / Self Care | Payer: BLUE CROSS/BLUE SHIELD | Source: Home / Self Care | Attending: Family Medicine | Admitting: Family Medicine

## 2016-05-28 DIAGNOSIS — IMO0001 Reserved for inherently not codable concepts without codable children: Secondary | ICD-10-CM

## 2016-05-28 DIAGNOSIS — L03012 Cellulitis of left finger: Secondary | ICD-10-CM | POA: Diagnosis not present

## 2016-05-28 MED ORDER — MUPIROCIN 2 % EX OINT: 1.0000 "application " | TOPICAL_OINTMENT | Freq: Three times a day (TID) | CUTANEOUS | Status: DC

## 2016-05-28 MED ORDER — CEPHALEXIN 500 MG PO CAPS: 500.0000 mg | ORAL_CAPSULE | Freq: Three times a day (TID) | ORAL | Status: DC

## 2016-05-28 NOTE — Discharge Instructions (Signed)
Paronychia °Paronychia is an infection of the skin that surrounds a nail. It usually affects the skin around a fingernail, but it may also occur near a toenail. It often causes pain and swelling around the nail. This condition may come on suddenly or develop over a longer period. In some cases, a collection of pus (abscess) can form near or under the nail. Usually, paronychia is not serious and it clears up with treatment. °CAUSES °This condition may be caused by bacteria or fungi. It is commonly caused by either Streptococcus or Staphylococcus bacteria. The bacteria or fungi often cause the infection by getting into the affected area through an opening in the skin, such as a cut or a hangnail. °RISK FACTORS °This condition is more likely to develop in: °· People who get their hands wet often, such as those who work as dishwashers, bartenders, or nurses. °· People who bite their fingernails or suck their thumbs. °· People who trim their nails too short. °· People who have hangnails or injured fingertips. °· People who get manicures. °· People who have diabetes. °SYMPTOMS °Symptoms of this condition include: °· Redness and swelling of the skin near the nail. °· Tenderness around the nail when you touch the area. °· Pus-filled bumps under the cuticle. The cuticle is the skin at the base or sides of the nail. °· Fluid or pus under the nail. °· Throbbing pain in the area. °DIAGNOSIS °This condition is usually diagnosed with a physical exam. In some cases, a sample of pus may be taken from an abscess to be tested in a lab. This can help to determine what type of bacteria or fungi is causing the condition. °TREATMENT °Treatment for this condition depends on the cause and severity of the condition. If the condition is mild, it may clear up on its own in a few days. Your health care provider may recommend soaking the affected area in warm water a few times a day. When treatment is needed, the options may  include: °· Antibiotic medicine, if the condition is caused by a bacterial infection. °· Antifungal medicine, if the condition is caused by a fungal infection. °· Incision and drainage, if an abscess is present. In this procedure, the health care provider will cut open the abscess so the pus can drain out. °HOME CARE INSTRUCTIONS °· Soak the affected area in warm water if directed to do so by your health care provider. You may be told to do this for 20 minutes, 2-3 times a day. Keep the area dry in between soakings. °· Take medicines only as directed by your health care provider. °· If you were prescribed an antibiotic medicine, finish all of it even if you start to feel better. °· Keep the affected area clean. °· Do not try to drain a fluid-filled bump yourself. °· If you will be washing dishes or performing other tasks that require your hands to get wet, wear rubber gloves. You should also wear gloves if your hands might come in contact with irritating substances, such as cleaners or chemicals. °· Follow your health care provider's instructions about: °¨ Wound care. °¨ Bandage (dressing) changes and removal. °SEEK MEDICAL CARE IF: °· Your symptoms get worse or do not improve with treatment. °· You have a fever or chills. °· You have redness spreading from the affected area. °· You have continued or increased fluid, blood, or pus coming from the affected area. °· Your finger or knuckle becomes swollen or is difficult to move. °  °  This information is not intended to replace advice given to you by your health care provider. Make sure you discuss any questions you have with your health care provider. °  °Document Released: 04/25/2001 Document Revised: 03/16/2015 Document Reviewed: 10/07/2014 °[employer] Interactive Patient Education ©2016 [employer] ° °

## 2016-05-28 NOTE — ED Provider Notes (Signed)
CSN: [id number]     Arrival date & time 05/28/16  1102 History   First MD Initiated Contact with Patient 05/28/16 1109     Chief Complaint  Patient presents with  . Hand Pain   (Consider location/radiation/quality/duration/timing/severity/associated sxs/prior Treatment) HPI Nancy Lucas is a 40 y.o. female presenting to UC with c/o gradually worsening Left fourth finger pain that started about 1 week ago.  Pt notes it started after she pulled a hangnail. She had worsening redness, swelling and pain. Pain woke her around 2AM this morning, started to drain greenish pus. Swelling has improved significantly since it drained.  Pain and redness still present, 1/10 aching. No fever, n/v/d.     Past Medical History  Diagnosis Date  . Seasonal allergies   . Hyperlipidemia   . Hx of varicella   . AMA (advanced maternal age) multigravida 35+    Past Surgical History  Procedure Laterality Date  . Dilation and curettage of uterus     Family History  Problem Relation Age of Onset  . Hyperlipidemia Mother   . Hyperlipidemia Maternal Grandfather    Social History  Substance Use Topics  . Smoking status: Never Smoker   . Smokeless tobacco: Never Used  . Alcohol Use: Yes   OB History    Gravida Para Term Preterm AB TAB SAB Ectopic Multiple Living   4 3 3       0 3     Review of Systems  Constitutional: Negative for fever and chills.  Gastrointestinal: Negative for nausea and vomiting.  Musculoskeletal: Positive for myalgias, joint swelling and arthralgias.  Skin: Positive for color change and wound.    Allergies  Review of patient's allergies indicates no known allergies.  Home Medications   Prior to Admission medications   Medication Sig Start Date End Date Taking? Authorizing Provider  cephALEXin (KEFLEX) 500 MG capsule Take 1 capsule (500 mg total) by mouth 3 (three) times daily. For 7 days 05/28/16   Junius FinnerErin O'Malley, PA-C  ferrous sulfate 325 (65 FE) MG tablet Take 1 tablet (325  mg total) by mouth 2 (two) times daily with a meal. 04/02/16   Essie HartWalda Pinn, MD  ibuprofen (ADVIL,MOTRIN) 600 MG tablet Take 1 tablet (600 mg total) by mouth every 6 (six) hours. 04/02/16   Essie HartWalda Pinn, MD  mupirocin ointment (BACTROBAN) 2 % Place 1 application into the nose 3 (three) times daily. To finger for 5 days 05/28/16   Junius FinnerErin O'Malley, PA-C  omeprazole (PRILOSEC OTC) 20 MG tablet Take 20 mg by mouth daily.    Historical Provider, MD  Prenatal Vit-Fe Fumarate-FA (PRENATAL MULTIVITAMIN) TABS tablet Take 1 tablet by mouth at bedtime.    Historical Provider, MD   Meds Ordered and Administered this Visit  Medications - No data to display  BP 115/76 mmHg  Pulse 60  Temp(Src) 97.7 F (36.5 C) (Oral)  Ht 5\' 4"  (1.626 m)  Wt 150 lb 4 oz (68.153 kg)  BMI 25.78 kg/m2  SpO2 98% No data found.   Physical Exam  Constitutional: She is oriented to person, place, and time. She appears well-developed and well-nourished.  HENT:  Head: Normocephalic and atraumatic.  Eyes: EOM are normal.  Neck: Normal range of motion.  Cardiovascular: Normal rate.   Pulmonary/Chest: Effort normal.  Musculoskeletal: Normal range of motion. She exhibits edema and tenderness.  Left fourth finger: mild edema around nailbed, tenderness to distal aspect. Full ROM. Increased pain with full flexion.  Neurological: She is alert and oriented to  person, place, and time.  Skin: Skin is warm and dry. There is erythema.  Left fourth finger: mild edema with erythema around nailbed, tenderness to ulnar side of nailbed. No active bleeding or drainage.   Psychiatric: She has a normal mood and affect. Her behavior is normal.  Nursing note and vitals reviewed.   ED Course  Procedures (including critical care time)  Labs Review Labs Reviewed - No data to display  Imaging Review No results found.    MDM   1. Paronychia of fourth finger, left    Paronychia of Left fourth finger that reportedly drained spontaneously. No  indication for I&D at this time. Will treat with antibiotics.  Rx: keflex and mupirocin ointment. Home care instructions provided. F/u in 4-5 days if not improving, sooner if worsening. Patient verbalized understanding and agreement with treatment plan.     Junius Finner, PA-C 05/28/16 1147

## 2016-05-28 NOTE — ED Notes (Signed)
Pt had a hang nail earlier this week, and pulled it, yesterday it started with redness, swelling and pain.  Left ring finger started throbbing and woke her up last night.  She said that greenish puss drained from finger.

## 2016-05-30 ENCOUNTER — Telehealth: Payer: Self-pay | Admitting: Emergency Medicine

## 2016-05-30 NOTE — Telephone Encounter (Signed)
Inquired about patient's status; encourage them to call with questions/concerns.  

## 2016-09-19 ENCOUNTER — Other Ambulatory Visit: Payer: Self-pay | Admitting: Obstetrics and Gynecology

## 2016-09-20 LAB — CYTOLOGY - PAP

## 2016-09-22 ENCOUNTER — Other Ambulatory Visit: Payer: Self-pay | Admitting: Obstetrics and Gynecology

## 2016-09-22 DIAGNOSIS — R928 Other abnormal and inconclusive findings on diagnostic imaging of breast: Secondary | ICD-10-CM

## 2016-09-29 ENCOUNTER — Ambulatory Visit
Admission: RE | Admit: 2016-09-29 | Discharge: 2016-09-29 | Disposition: A | Discharge status: Home / Self Care | Payer: BLUE CROSS/BLUE SHIELD | Source: Ambulatory Visit | Attending: Obstetrics and Gynecology | Admitting: Obstetrics and Gynecology

## 2016-09-29 ENCOUNTER — Other Ambulatory Visit: Payer: BLUE CROSS/BLUE SHIELD

## 2016-09-29 DIAGNOSIS — R928 Other abnormal and inconclusive findings on diagnostic imaging of breast: Secondary | ICD-10-CM

## 2016-11-02 ENCOUNTER — Other Ambulatory Visit: Payer: Self-pay | Admitting: Obstetrics and Gynecology

## 2016-11-02 DIAGNOSIS — N63 Unspecified lump in unspecified breast: Secondary | ICD-10-CM

## 2016-11-08 ENCOUNTER — Ambulatory Visit
Admission: RE | Admit: 2016-11-08 | Discharge: 2016-11-08 | Disposition: A | Discharge status: Home / Self Care | Payer: BLUE CROSS/BLUE SHIELD | Source: Ambulatory Visit | Attending: Obstetrics and Gynecology | Admitting: Obstetrics and Gynecology

## 2016-11-08 DIAGNOSIS — N63 Unspecified lump in unspecified breast: Secondary | ICD-10-CM

## 2017-04-25 IMAGING — MG 2D DIGITAL DIAGNOSTIC UNILATERAL LEFT MAMMOGRAM WITH CAD AND ADJ
6 series · 6 of 14 positions shown · non-contrast
Comparison: Previous exam(s).

CLINICAL DATA: Screening recall for left breast mass.

EXAM:
2D DIGITAL DIAGNOSTIC UNILATERAL LEFT MAMMOGRAM WITH CAD AND ADJUNCT
TOMO
LEFT BREAST ULTRASOUND

[L CC synth-2D]
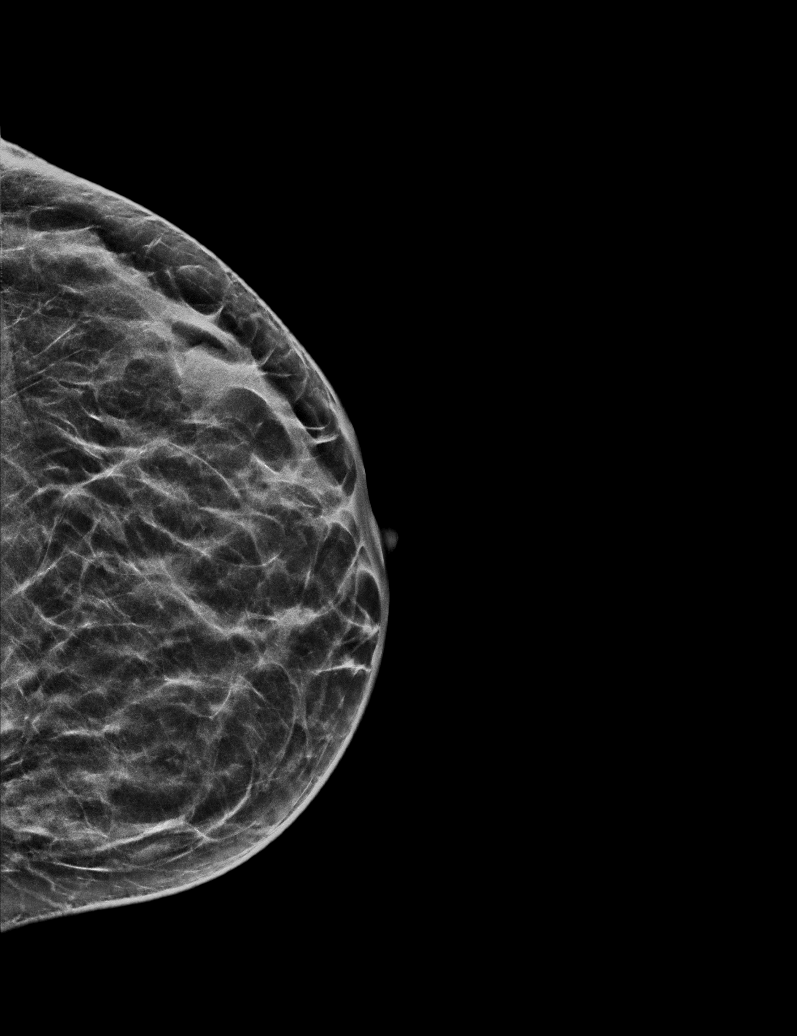

[L MLO]
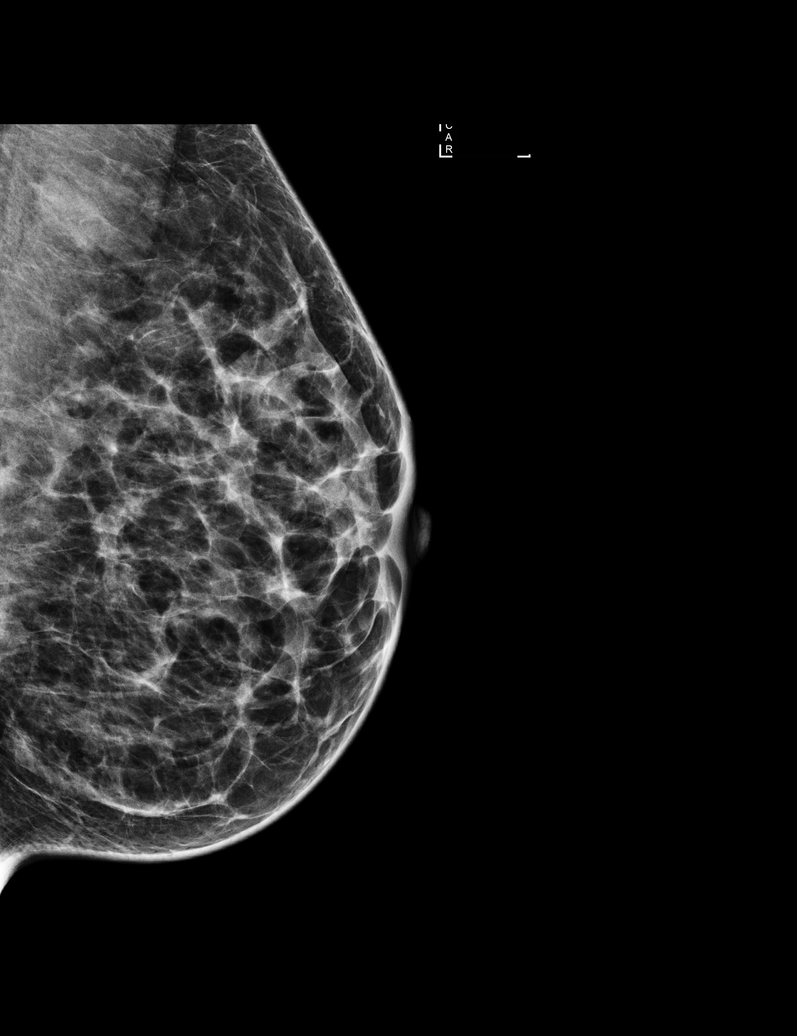

[L CC]
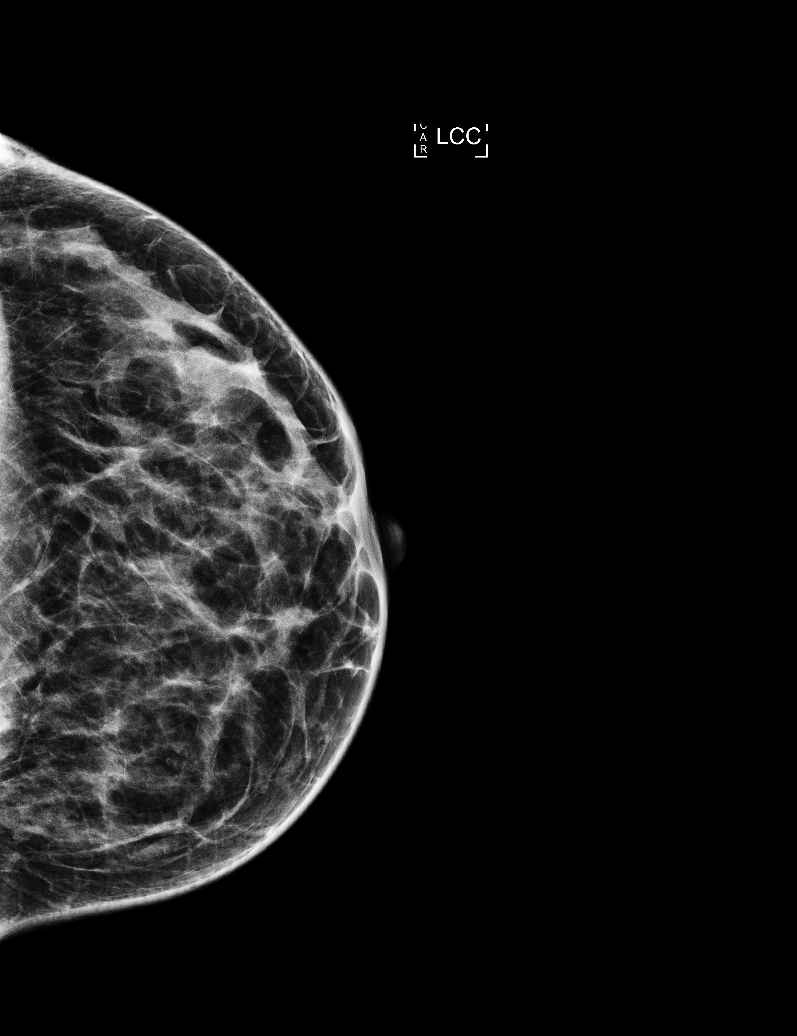

[L MLO synth-2D]
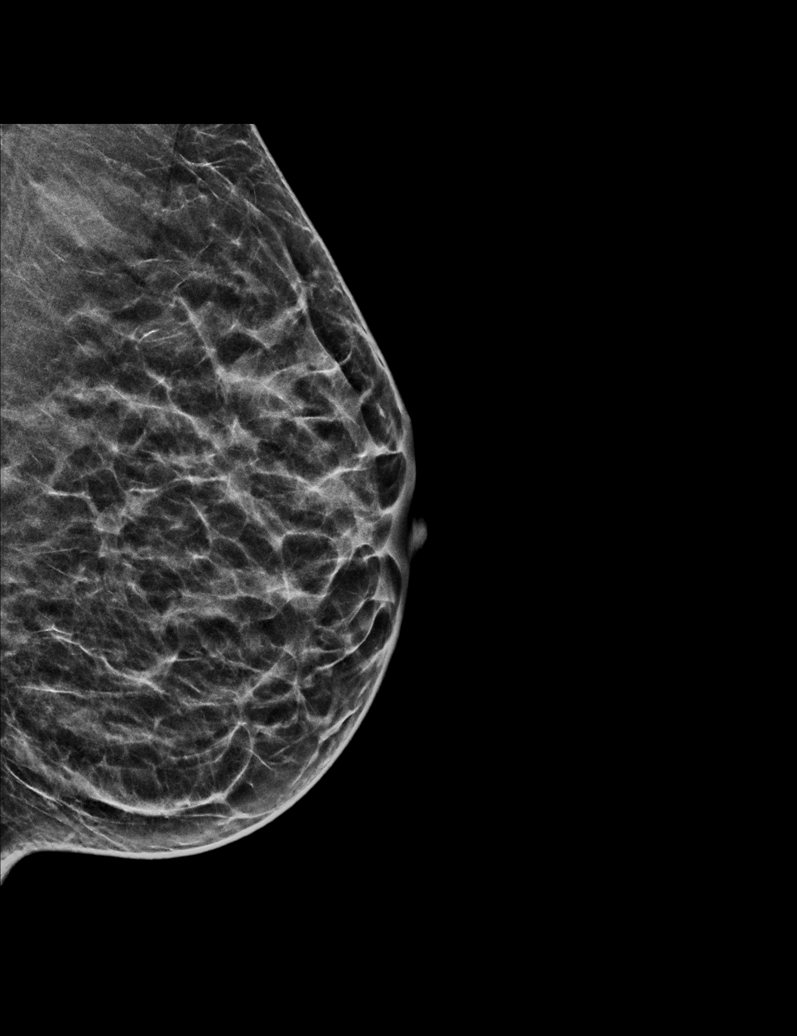

[L MLO tomo · tomo slice 22/43.0]
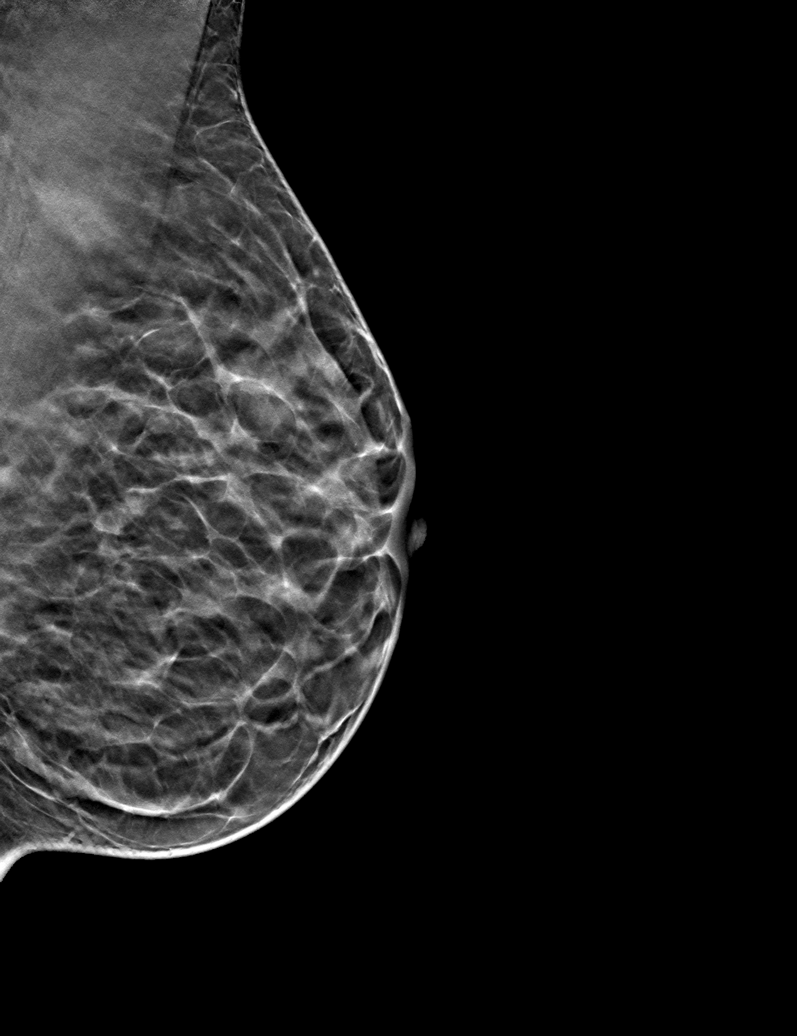

[L CC tomo · tomo slice 23/45.0]
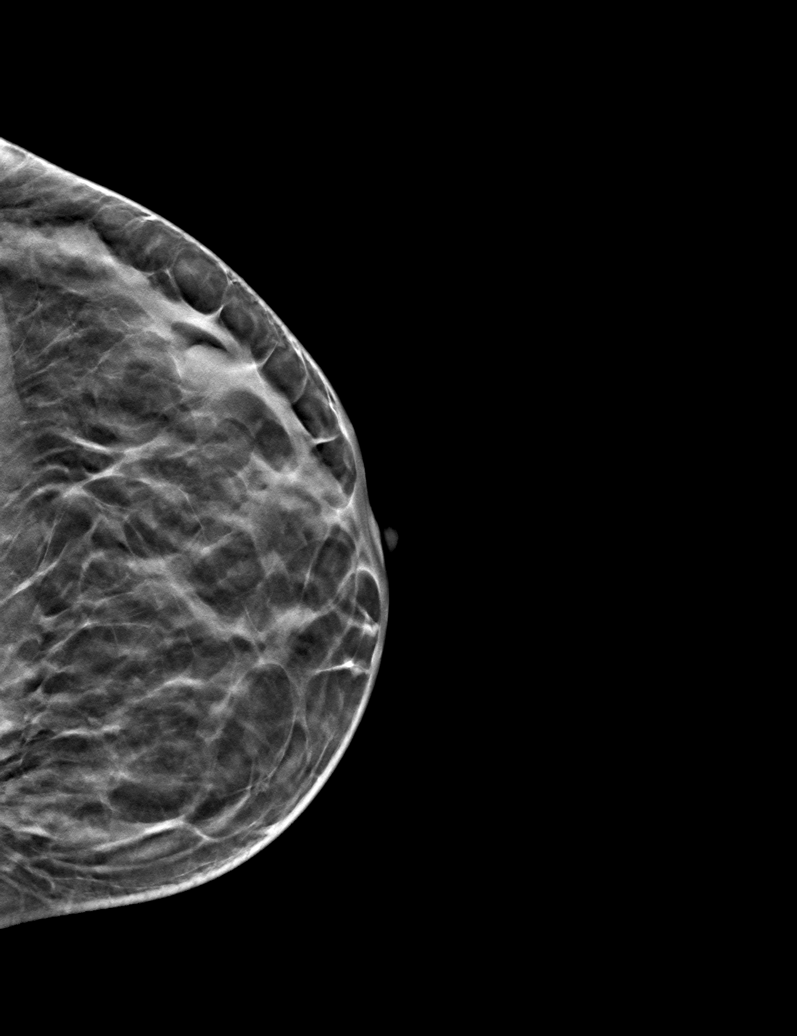

[6 of 14 positions shown; findings below may reference images not displayed]

ACR Breast Density Category c: The breast tissue is heterogeneously
dense, which may obscure small masses.
FINDINGS: Cc and MLO tomograms were performed of the left breast demonstrating
an oval mass with obscured margins in the upper-outer left breast
measuring approximately 1 cm. In addition there is an asymmetry seen
in the far upper outer left breast that demonstrates imaging
features most suggestive of an area of likely focal fibroglandular
tissue.

Mammographic images were processed with CAD.

Physical examination of the upper-outer left breast reveals a mobile
nodular area at the approximate 2 o'clock position. Targeted
ultrasound of the left breast was performed demonstrating an oval
well-circumscribed hypoechoic mass at 2 o'clock 2 cm from nipple
measuring 1.2 x 0.8 x 1.2 cm. This corresponds with the mass seen at
mammography.

Targeted ultrasound of the far upper outer left breast/left axilla
demonstrates an area of fibroglandular tissue extending into the
axilla. This corresponds with the asymmetry seen in the far upper
outer left breast.
IMPRESSION: Probably benign mass in the left breast at 2 o'clock 2 cm from the
nipple demonstrating imaging features most suggestive of a
fibroadenoma.

RECOMMENDATION:
Left breast ultrasound in 6 months.

I have discussed the findings and recommendations with the patient.
Results were also provided in writing at the conclusion of the
visit. If applicable, a reminder letter will be sent to the patient
regarding the next appointment.

BI-RADS CATEGORY  3: Probably benign.

## 2018-05-19 ENCOUNTER — Other Ambulatory Visit: Payer: Self-pay

## 2018-05-19 ENCOUNTER — Emergency Department (INDEPENDENT_AMBULATORY_CARE_PROVIDER_SITE_OTHER)
Admission: EM | Admit: 2018-05-19 | Discharge: 2018-05-19 | Disposition: A | Discharge status: Home / Self Care | Payer: BLUE CROSS/BLUE SHIELD | Source: Home / Self Care | Attending: Family Medicine | Admitting: Family Medicine

## 2018-05-19 DIAGNOSIS — N760 Acute vaginitis: Secondary | ICD-10-CM | POA: Diagnosis not present

## 2018-05-19 DIAGNOSIS — B9689 Other specified bacterial agents as the cause of diseases classified elsewhere: Secondary | ICD-10-CM | POA: Diagnosis not present

## 2018-05-19 LAB — POCT URINALYSIS DIP (MANUAL ENTRY)
BILIRUBIN UA: NEGATIVE
BILIRUBIN UA: NEGATIVE mg/dL
GLUCOSE UA: NEGATIVE mg/dL
Leukocytes, UA: NEGATIVE
NITRITE UA: NEGATIVE
Protein Ur, POC: NEGATIVE mg/dL
SPEC GRAV UA: 1.01 (ref 1.010–1.025)
Urobilinogen, UA: 0.2 E.U./dL — AB
pH, UA: 7 (ref 5.0–8.0)

## 2018-05-19 MED ORDER — METRONIDAZOLE 500 MG PO TABS: ORAL_TABLET | ORAL | 0 refills | Status: DC

## 2018-05-19 NOTE — Discharge Instructions (Addendum)
During treatment: Avoid sexual activity until you finish treatment. Do not douche. Avoid alcohol.

## 2018-05-19 NOTE — ED Triage Notes (Signed)
Awoke today with sense of vaginal itching and wonders if yeast infection; had new sexual partner 1 week ago and would like to be checked for STD; denies discharge; does routinely experience frequency.

## 2018-05-19 NOTE — ED Provider Notes (Signed)
[facility]    CSN: [id number] Arrival date & time: 05/19/18  1501     History   Chief Complaint Chief Complaint  Patient presents with  . Vaginitis    HPI Nancy Lucas is a 42 y.o. female.   Patient complains of onset of vaginal itching yesterday without discharge or rash.  No pelvic or abdominal pain.  No recent antibiotic use.  She states that she had new sexual partner one week ago and would like to be checked for STD.  No urinary symptoms.  Patient has IUD in place.  The history is provided by the patient.    Past Medical History:  Diagnosis Date  . AMA (advanced maternal age) multigravida 35+   . Hx of varicella   . Hyperlipidemia   . Seasonal allergies     Patient Active Problem List   Diagnosis Date Noted  . Pregnancy 03/31/2016  . Normal vaginal delivery 03/31/2016    Past Surgical History:  Procedure Laterality Date  . DILATION AND CURETTAGE OF UTERUS      OB History    Gravida  4   Para  3   Term  3   Preterm      AB      Living  3     SAB      TAB      Ectopic      Multiple  0   Live Births  3            Home Medications    Prior to Admission medications   Medication Sig Start Date End Date Taking? Authorizing Provider  levonorgestrel (MIRENA) 20 MCG/24HR IUD 1 each by Intrauterine route once.   Yes [provider]  ferrous sulfate 325 (65 FE) MG tablet Take 1 tablet (325 mg total) by mouth 2 (two) times daily with a meal. 04/02/16   Essie Hart, MD  ibuprofen (ADVIL,MOTRIN) 600 MG tablet Take 1 tablet (600 mg total) by mouth every 6 (six) hours. 04/02/16   Essie Hart, MD  metroNIDAZOLE (FLAGYL) 500 MG tablet Take one tab by mouth every 12 hours for 7 days. 05/19/18   Lattie Haw, MD  omeprazole (PRILOSEC OTC) 20 MG tablet Take 20 mg by mouth daily.    [provider]  Prenatal Vit-Fe Fumarate-FA (PRENATAL MULTIVITAMIN) TABS tablet Take 1 tablet by mouth at bedtime.    [provider]    Family History Family History  Problem Relation Age of Onset  . Hyperlipidemia Mother   . Hyperlipidemia Maternal Grandfather     Social History Social History   Tobacco Use  . Smoking status: Never Smoker  . Smokeless tobacco: Never Used  Substance Use Topics  . Alcohol use: Yes  . Drug use: No     Allergies   Patient has no known allergies.   Review of Systems Review of Systems  Constitutional: Negative.   HENT: Negative.   Eyes: Negative.   Respiratory: Negative.   Cardiovascular: Negative.   Gastrointestinal: Negative for abdominal pain.  Genitourinary: Negative for dysuria, frequency, genital sores, menstrual problem, pelvic pain, urgency, vaginal bleeding, vaginal discharge and vaginal pain.  Musculoskeletal: Negative.   Skin: Negative for rash.  Neurological: Negative for headaches.     Physical Exam Triage Vital Signs ED Triage Vitals  Enc Vitals Group     BP 05/19/18 1559 115/75     Pulse Rate 05/19/18 1559 78     Resp 05/19/18 1559 16  Temp 05/19/18 1559 98.3 F (36.8 C)     Temp Source 05/19/18 1559 Oral     SpO2 05/19/18 1559 100 %     Weight 05/19/18 1600 140 lb (63.5 kg)     Height 05/19/18 1600 5\' 4"  (1.626 m)     Head Circumference --      Peak Flow --      Pain Score 05/19/18 1600 0     Pain Loc --      Pain Edu? --      Excl. in GC? --    No data found.  Updated Vital Signs BP 115/75 (BP Location: Right Arm)   Pulse 78   Temp 98.3 F (36.8 C) (Oral)   Resp 16   Ht 5\' 4"  (1.626 m)   Wt 140 lb (63.5 kg)   SpO2 100%   BMI 24.03 kg/m   Visual Acuity Right Eye Distance:   Left Eye Distance:   Bilateral Distance:    Right Eye Near:   Left Eye Near:    Bilateral Near:     Physical Exam Nursing notes and Vital Signs reviewed. Appearance:  Patient appears stated age, and in no acute distress.    Eyes:  Pupils are equal, round, and reactive to light and accomodation.  Extraocular movement is intact.  Conjunctivae  are not inflamed   Pharynx:  Normal; moist mucous membranes  Neck:  Supple.  No adenopathy Lungs:  Clear to auscultation.  Breath sounds are equal.  Moving air well. Heart:  Regular rate and rhythm without murmurs, rubs, or gallops.  Abdomen:  Nontender without masses or hepatosplenomegaly.  Bowel sounds are present.  No CVA or flank tenderness.  Extremities:  No edema.  Skin:  No rash present.    Pelvic exam deferred:  Patient instructed in obtaining vaginal specimen.  UC Treatments / Results  Labs (all labs ordered are listed, but only abnormal results are displayed) Labs Reviewed  POCT URINALYSIS DIP (MANUAL ENTRY) - Abnormal; Notable for the following components:      Result Value   Color, UA light yellow (*)    Blood, UA trace-intact (*)    Urobilinogen, UA 0.2 (*)    All other components within normal limits  GC/CHLAMYDIA PROBE AMP  POCT WET + KOH PREP:  Negative yeast, negative trich, positive clue cells, positive epithelial cells    EKG None  Radiology No results found.  Procedures Procedures (including critical care time)  Medications Ordered in UC Medications - No data to display  Initial Impression / Assessment and Plan / UC Course  I have reviewed the triage vital signs and the nursing notes.  Pertinent labs & imaging results that were available during my care of the patient were reviewed by me and considered in my medical decision making (see chart for details).    Begin Flagyl oral. GC/chlamydia pending. Followup with Family Doctor if not improved in one week.    Final Clinical Impressions(s) / UC Diagnoses   Final diagnoses:  Bacterial vaginosis     Discharge Instructions      During treatment: ? Avoid sexual activity until you finish treatment. ? Do not douche. ? Avoid alcohol.    ED Prescriptions    Medication Sig Dispense Auth. Provider   metroNIDAZOLE (FLAGYL) 500 MG tablet Take one tab by mouth every 12 hours for 7 days. 14 tablet  Lattie HawBeese, Stephen A, MD         Lattie HawBeese, Stephen A, MD 05/27/18 [id number]

## 2018-05-20 ENCOUNTER — Telehealth: Payer: Self-pay

## 2018-05-20 LAB — C. TRACHOMATIS/N. GONORRHOEAE RNA
C. TRACHOMATIS RNA, TMA: NOT DETECTED
N. GONORRHOEAE RNA, TMA: NOT DETECTED

## 2018-05-20 NOTE — Telephone Encounter (Signed)
Left message to call for lab results. 

## 2018-05-25 NOTE — Telephone Encounter (Signed)
Patient called in stating that her problem had improved but was not totally resolved. She wanted to check in with Dr. Cathren HarshBeese to make sure her symptoms were resolved prior to leaving the country. She prefers to see Dr. Cathren HarshBeese. She will call back the first part of the week.

## 2018-11-04 ENCOUNTER — Other Ambulatory Visit (HOSPITAL_COMMUNITY)
Admission: RE | Admit: 2018-11-04 | Discharge: 2018-11-04 | Disposition: A | Discharge status: Home / Self Care | Payer: BLUE CROSS/BLUE SHIELD | Source: Ambulatory Visit | Attending: Internal Medicine | Admitting: Internal Medicine

## 2018-11-04 ENCOUNTER — Encounter: Payer: Self-pay | Admitting: Emergency Medicine

## 2018-11-04 ENCOUNTER — Other Ambulatory Visit: Payer: Self-pay

## 2018-11-04 ENCOUNTER — Emergency Department (INDEPENDENT_AMBULATORY_CARE_PROVIDER_SITE_OTHER)
Admission: EM | Admit: 2018-11-04 | Discharge: 2018-11-04 | Disposition: A | Discharge status: Home / Self Care | Payer: BLUE CROSS/BLUE SHIELD | Source: Home / Self Care | Attending: Internal Medicine | Admitting: Internal Medicine

## 2018-11-04 DIAGNOSIS — B9689 Other specified bacterial agents as the cause of diseases classified elsewhere: Secondary | ICD-10-CM

## 2018-11-04 DIAGNOSIS — N760 Acute vaginitis: Secondary | ICD-10-CM | POA: Insufficient documentation

## 2018-11-04 MED ORDER — METRONIDAZOLE 500 MG PO TABS: ORAL_TABLET | ORAL | 0 refills | Status: DC

## 2018-11-04 MED ORDER — FLUCONAZOLE 150 MG PO TABS: 150.0000 mg | ORAL_TABLET | Freq: Every day | ORAL | 0 refills | Status: AC

## 2018-11-04 NOTE — ED Provider Notes (Signed)
[facility]    CSN: [id number] Arrival date & time: 11/04/18  [id number]     History   Chief Complaint Chief Complaint  Patient presents with  . Vaginal Itching    HPI Nancy Lucas is a 42 y.o. female.   42 year old female with no chronic medical illnesses presents to urgent care complaining of vaginal itching x3 days.  She denies discharge, fever, nausea, vomiting or diarrhea.  She also denies abdominal pain.     Past Medical History:  Diagnosis Date  . AMA (advanced maternal age) multigravida 35+   . Hx of varicella   . Hyperlipidemia   . Seasonal allergies     Patient Active Problem List   Diagnosis Date Noted  . Pregnancy 03/31/2016  . Normal vaginal delivery 03/31/2016    Past Surgical History:  Procedure Laterality Date  . DILATION AND CURETTAGE OF UTERUS      OB History    Gravida  4   Para  3   Term  3   Preterm      AB      Living  3     SAB      TAB      Ectopic      Multiple  0   Live Births  3            Home Medications    Prior to Admission medications   Medication Sig Start Date End Date Taking? Authorizing Provider  ferrous sulfate 325 (65 FE) MG tablet Take 1 tablet (325 mg total) by mouth 2 (two) times daily with a meal. 04/02/16  Yes Essie HartPinn, Walda, MD  Prenatal Vit-Fe Fumarate-FA (PRENATAL MULTIVITAMIN) TABS tablet Take 1 tablet by mouth at bedtime.   Yes [provider]  fluconazole (DIFLUCAN) 150 MG tablet Take 1 tablet (150 mg total) by mouth daily for 2 days. Take 1 tab po upon completion of antibiotics. If symptoms persist, may take 2nd tab 5 days later 11/04/18 11/06/18  Arnaldo Nataliamond, Myles Tavella S, MD  ibuprofen (ADVIL,MOTRIN) 600 MG tablet Take 1 tablet (600 mg total) by mouth every 6 (six) hours. 04/02/16   Essie HartPinn, Walda, MD  levonorgestrel (MIRENA) 20 MCG/24HR IUD 1 each by Intrauterine route once.    [provider]  metroNIDAZOLE (FLAGYL) 500 MG tablet Take one tab by mouth every 12 hours for 7  days. 11/04/18   Arnaldo Nataliamond, Jerime Arif S, MD  omeprazole (PRILOSEC OTC) 20 MG tablet Take 20 mg by mouth daily.    [provider]    Family History Family History  Problem Relation Age of Onset  . Hyperlipidemia Mother   . Hyperlipidemia Maternal Grandfather     Social History Social History   Tobacco Use  . Smoking status: Never Smoker  . Smokeless tobacco: Never Used  Substance Use Topics  . Alcohol use: Yes  . Drug use: No     Allergies   Patient has no known allergies.   Review of Systems Review of Systems  Constitutional: Negative for chills and fever.  HENT: Negative for sore throat and tinnitus.   Eyes: Negative for redness.  Respiratory: Negative for cough and shortness of breath.   Cardiovascular: Negative for chest pain and palpitations.  Gastrointestinal: Negative for abdominal pain, diarrhea, nausea and vomiting.  Genitourinary: Negative for dysuria, frequency and urgency.  Musculoskeletal: Negative for myalgias.  Skin: Negative for rash.       No lesions  Neurological: Negative for weakness.  Hematological: Does not bruise/bleed easily.  Psychiatric/Behavioral: Negative for suicidal ideas.     Physical Exam Triage Vital Signs ED Triage Vitals [11/04/18 0837]  Enc Vitals Group     BP 103/72     Pulse Rate 72     Resp 16     Temp 97.8 F (36.6 C)     Temp Source Oral     SpO2 100 %     Weight      Height      Head Circumference      Peak Flow      Pain Score 3     Pain Loc      Pain Edu?      Excl. in GC?    No data found.  Updated Vital Signs BP 103/72   Pulse 72   Temp 97.8 F (36.6 C) (Oral)   Resp 16   SpO2 100%   Visual Acuity Right Eye Distance:   Left Eye Distance:   Bilateral Distance:    Right Eye Near:   Left Eye Near:    Bilateral Near:     Physical Exam Vitals signs and nursing note reviewed.  Constitutional:      General: She is not in acute distress.    Appearance: She is well-developed.  HENT:      Head: Normocephalic and atraumatic.  Eyes:     General: No scleral icterus.    Conjunctiva/sclera: Conjunctivae normal.     Pupils: Pupils are equal, round, and reactive to light.  Neck:     Musculoskeletal: Normal range of motion and neck supple.     Thyroid: No thyromegaly.     Vascular: No JVD.     Trachea: No tracheal deviation.  Cardiovascular:     Rate and Rhythm: Normal rate and regular rhythm.     Heart sounds: Normal heart sounds. No murmur. No friction rub. No gallop.   Pulmonary:     Effort: Pulmonary effort is normal.     Breath sounds: Normal breath sounds.  Abdominal:     General: Bowel sounds are normal. There is no distension.     Palpations: Abdomen is soft.     Tenderness: There is no abdominal tenderness.  Musculoskeletal: Normal range of motion.  Lymphadenopathy:     Cervical: No cervical adenopathy.  Skin:    General: Skin is warm and dry.  Neurological:     Mental Status: She is alert and oriented to person, place, and time.     Cranial Nerves: No cranial nerve deficit.  Psychiatric:        Behavior: Behavior normal.        Thought Content: Thought content normal.        Judgment: Judgment normal.      UC Treatments / Results  Labs (all labs ordered are listed, but only abnormal results are displayed) Labs Reviewed  CERVICOVAGINAL ANCILLARY ONLY    EKG None  Radiology No results found.  Procedures Procedures (including critical care time)  Medications Ordered in UC Medications - No data to display  Initial Impression / Assessment and Plan / UC Course  I have reviewed the triage vital signs and the nursing notes.  Pertinent labs & imaging results that were available during my care of the patient were reviewed by me and considered in my medical decision making (see chart for details).     Empiric treatment for BV (will cover trichomonas as well).  Also prescribed Diflucan.  Will call with results of GC chlamydia  Final  Clinical  Impressions(s) / UC Diagnoses   Final diagnoses:  BV (bacterial vaginosis)   Discharge Instructions   None    ED Prescriptions    Medication Sig Dispense Auth. Provider   metroNIDAZOLE (FLAGYL) 500 MG tablet Take one tab by mouth every 12 hours for 7 days. 14 tablet Arnaldo Nataliamond, Ellanor Feuerstein S, MD   fluconazole (DIFLUCAN) 150 MG tablet Take 1 tablet (150 mg total) by mouth daily for 2 days. Take 1 tab po upon completion of antibiotics. If symptoms persist, may take 2nd tab 5 days later 2 tablet Arnaldo Nataliamond, Joscelin Fray S, MD     Controlled Substance Prescriptions [place] Controlled Substance Registry consulted? Not Applicable   Arnaldo Nataliamond, Bani Gianfrancesco S, MD 11/04/18 [phone]

## 2018-11-04 NOTE — ED Triage Notes (Signed)
PT reports vaginal itching/ discomfort for 3 days. PT was treated for BV a few months ago and reports this feels the same.

## 2018-11-05 ENCOUNTER — Telehealth: Payer: Self-pay

## 2018-11-05 LAB — CERVICOVAGINAL ANCILLARY ONLY
Bacterial vaginitis: NEGATIVE
Candida vaginitis: POSITIVE — AB
Chlamydia: NEGATIVE
Neisseria Gonorrhea: NEGATIVE
Trichomonas: NEGATIVE

## 2018-11-07 ENCOUNTER — Telehealth: Payer: Self-pay | Admitting: *Deleted

## 2018-11-07 NOTE — Telephone Encounter (Signed)
Patient returned call; still some vaginal itching; gave her wet prep final results; will take second dose of diflucan.

## 2018-11-07 NOTE — Telephone Encounter (Signed)
LM to call back for lab results. Nancy Petion, LPN  

## 2021-06-15 ENCOUNTER — Other Ambulatory Visit: Payer: Self-pay | Admitting: Obstetrics

## 2021-06-15 DIAGNOSIS — Z1231 Encounter for screening mammogram for malignant neoplasm of breast: Secondary | ICD-10-CM

## 2021-06-17 ENCOUNTER — Ambulatory Visit
Admission: RE | Admit: 2021-06-17 | Discharge: 2021-06-17 | Disposition: A | Discharge status: Home / Self Care | Payer: No Typology Code available for payment source | Source: Ambulatory Visit | Attending: Obstetrics | Admitting: Obstetrics

## 2021-06-17 ENCOUNTER — Other Ambulatory Visit: Payer: Self-pay

## 2021-06-17 DIAGNOSIS — Z1231 Encounter for screening mammogram for malignant neoplasm of breast: Secondary | ICD-10-CM

## 2021-08-19 ENCOUNTER — Other Ambulatory Visit: Payer: Self-pay

## 2021-08-19 ENCOUNTER — Emergency Department
Admission: RE | Admit: 2021-08-19 | Discharge: 2021-08-19 | Disposition: A | Discharge status: Home / Self Care | Payer: No Typology Code available for payment source | Source: Ambulatory Visit

## 2021-08-19 VITALS — BP 126/84 | HR 67 | Temp 98.1°F | Resp 18

## 2021-08-19 DIAGNOSIS — J029 Acute pharyngitis, unspecified: Secondary | ICD-10-CM | POA: Diagnosis not present

## 2021-08-19 DIAGNOSIS — J309 Allergic rhinitis, unspecified: Secondary | ICD-10-CM

## 2021-08-19 MED ORDER — FEXOFENADINE HCL 180 MG PO TABS: 180.0000 mg | ORAL_TABLET | Freq: Every day | ORAL | 0 refills | Status: AC

## 2021-08-19 MED ORDER — CEFDINIR 300 MG PO CAPS: 300.0000 mg | ORAL_CAPSULE | Freq: Two times a day (BID) | ORAL | 0 refills | Status: AC

## 2021-08-19 NOTE — ED Provider Notes (Signed)
[facility]    CSN: [id number] Arrival date & time: 08/19/21  1058      History   Chief Complaint Chief Complaint  Patient presents with   Sore Throat    Appt 11am    HPI Nancy Lucas is a 45 y.o. female.   HPI 45 year old female presents with sore throat and runny nose for 3 weeks.  Denies sick contacts or fever.  Past Medical History:  Diagnosis Date   AMA (advanced maternal age) multigravida 35+    Hx of varicella    Hyperlipidemia    Seasonal allergies     Patient Active Problem List   Diagnosis Date Noted   Pregnancy 03/31/2016   Normal vaginal delivery 03/31/2016    Past Surgical History:  Procedure Laterality Date   DILATION AND CURETTAGE OF UTERUS      OB History     Gravida  4   Para  3   Term  3   Preterm      AB      Living  3      SAB      IAB      Ectopic      Multiple  0   Live Births  3            Home Medications    Prior to Admission medications   Medication Sig Start Date End Date Taking? Authorizing Provider  cefdinir (OMNICEF) 300 MG capsule Take 1 capsule (300 mg total) by mouth 2 (two) times daily for 7 days. 08/19/21 08/26/21 Yes Trevor Iha, FNP  fexofenadine [facility] ALLERGY) 180 MG tablet Take 1 tablet (180 mg total) by mouth daily for 15 days. 08/19/21 09/03/21 Yes Trevor Iha, FNP  ferrous sulfate 325 (65 FE) MG tablet Take 1 tablet (325 mg total) by mouth 2 (two) times daily with a meal. 04/02/16   Essie Hart, MD  ibuprofen (ADVIL,MOTRIN) 600 MG tablet Take 1 tablet (600 mg total) by mouth every 6 (six) hours. 04/02/16   Essie Hart, MD  levonorgestrel (MIRENA) 20 MCG/24HR IUD 1 each by Intrauterine route once.    [provider]  metroNIDAZOLE (FLAGYL) 500 MG tablet Take one tab by mouth every 12 hours for 7 days. 11/04/18   Arnaldo Natal, MD  omeprazole (PRILOSEC OTC) 20 MG tablet Take 20 mg by mouth daily.    [provider]  Prenatal Vit-Fe Fumarate-FA  (PRENATAL MULTIVITAMIN) TABS tablet Take 1 tablet by mouth at bedtime.    [provider]    Family History Family History  Problem Relation Age of Onset   Hyperlipidemia Mother    Hyperlipidemia Maternal Grandfather     Social History Social History   Tobacco Use   Smoking status: Never   Smokeless tobacco: Never  Substance Use Topics   Alcohol use: Yes   Drug use: No     Allergies   Patient has no known allergies.   Review of Systems Review of Systems  HENT:  Positive for postnasal drip, rhinorrhea and sore throat.   All other systems reviewed and are negative.   Physical Exam Triage Vital Signs ED Triage Vitals  Enc Vitals Group     BP 08/19/21 1105 126/84     Pulse Rate 08/19/21 1105 67     Resp 08/19/21 1105 18     Temp 08/19/21 1105 98.1 F (36.7 C)     Temp Source 08/19/21 1105 Oral     SpO2 08/19/21 1105  100 %     Weight --      Height --      Head Circumference --      Peak Flow --      Pain Score 08/19/21 1107 0     Pain Loc --      Pain Edu? --      Excl. in GC? --    No data found.  Updated Vital Signs BP 126/84 (BP Location: Right Arm)   Pulse 67   Temp 98.1 F (36.7 C) (Oral)   Resp 18   LMP 08/05/2021 (Exact Date)   SpO2 100%      Physical Exam Vitals and nursing note reviewed.  Constitutional:      General: She is not in acute distress.    Appearance: Normal appearance. She is normal weight. She is not ill-appearing.  HENT:     Head: Normocephalic and atraumatic.     Right Ear: Tympanic membrane, ear canal and external ear normal.     Left Ear: Tympanic membrane, ear canal and external ear normal.     Mouth/Throat:     Mouth: Mucous membranes are moist.     Pharynx: Oropharynx is clear.     Comments: Mild amount of clear drainage of posterior oropharynx noted Eyes:     Extraocular Movements: Extraocular movements intact.     Conjunctiva/sclera: Conjunctivae normal.     Pupils: Pupils are equal, round, and  reactive to light.  Cardiovascular:     Rate and Rhythm: Normal rate and regular rhythm.     Pulses: Normal pulses.     Heart sounds: Normal heart sounds.  Pulmonary:     Effort: Pulmonary effort is normal.     Breath sounds: Normal breath sounds.  Musculoskeletal:        General: Normal range of motion.     Cervical back: Normal range of motion and neck supple.  Skin:    General: Skin is warm and dry.  Neurological:     General: No focal deficit present.     Mental Status: She is alert and oriented to person, place, and time. Mental status is at baseline.  Psychiatric:        Mood and Affect: Mood normal.        Behavior: Behavior normal.        Thought Content: Thought content normal.     UC Treatments / Results  Labs (all labs ordered are listed, but only abnormal results are displayed) Labs Reviewed - No data to display  EKG   Radiology No results found.  Procedures Procedures (including critical care time)  Medications Ordered in UC Medications - No data to display  Initial Impression / Assessment and Plan / UC Course  I have reviewed the triage vital signs and the nursing notes.  Pertinent labs & imaging results that were available during my care of the patient were reviewed by me and considered in my medical decision making (see chart for details).     MDM: 1.  Pharyngitis-Rx'd Cefdinir; 2.  Allergic rhinitis-Rx'd Allegra. Advised/instructed patient to take medication as directed with food to completion.  Advised patient to take Allegra daily for the next 3-5 days for concurrent postnasal drainage/drip, then as needed.  Encouraged patient to increase daily water intake while taking these medications.  Discharged home, hemodynamically stable. Final Clinical Impressions(s) / UC Diagnoses   Final diagnoses:  Sore throat  Pharyngitis, unspecified etiology  Allergic rhinitis, unspecified seasonality, unspecified trigger  Discharge Instructions       Advised/instructed patient to take medication as directed with food to completion.  Advised patient to take Allegra daily for the next 3-5 days for concurrent postnasal drainage/drip, then as needed.  Encouraged patient to increase daily water intake while taking these medications.     ED Prescriptions     Medication Sig Dispense Auth. Provider   cefdinir (OMNICEF) 300 MG capsule Take 1 capsule (300 mg total) by mouth 2 (two) times daily for 7 days. 14 capsule Trevor Iha, FNP   fexofenadine [facility] ALLERGY) 180 MG tablet Take 1 tablet (180 mg total) by mouth daily for 15 days. 15 tablet Trevor Iha, FNP      PDMP not reviewed this encounter.   Trevor Iha, FNP 08/19/21 1213

## 2021-08-19 NOTE — Discharge Instructions (Addendum)
Advised/instructed patient to take medication as directed with food to completion.  Advised patient to take Allegra daily for the next 3-5 days for concurrent postnasal drainage/drip, then as needed.  Encouraged patient to increase daily water intake while taking these medications.

## 2021-08-19 NOTE — ED Triage Notes (Signed)
Pt c/o sore throat and runny nose x 3 weeks. Denies fever. Zyrtec (last couple days) and Flonase daily.

## 2022-04-21 ENCOUNTER — Other Ambulatory Visit: Payer: Self-pay | Admitting: Obstetrics

## 2022-04-21 DIAGNOSIS — Z1231 Encounter for screening mammogram for malignant neoplasm of breast: Secondary | ICD-10-CM

## 2022-06-19 ENCOUNTER — Ambulatory Visit
Admission: RE | Admit: 2022-06-19 | Discharge: 2022-06-19 | Disposition: A | Discharge status: Home / Self Care | Payer: No Typology Code available for payment source | Source: Ambulatory Visit | Attending: Obstetrics | Admitting: Obstetrics

## 2022-06-19 DIAGNOSIS — Z1231 Encounter for screening mammogram for malignant neoplasm of breast: Secondary | ICD-10-CM

## 2022-11-02 ENCOUNTER — Ambulatory Visit: Admit: 2022-11-02 | Discharge status: Absent without leave | Payer: No Typology Code available for payment source

## 2022-11-02 ENCOUNTER — Ambulatory Visit
Admission: EM | Admit: 2022-11-02 | Discharge: 2022-11-02 | Disposition: A | Discharge status: Home / Self Care | Payer: No Typology Code available for payment source | Attending: Family Medicine | Admitting: Family Medicine

## 2022-11-02 DIAGNOSIS — R490 Dysphonia: Secondary | ICD-10-CM | POA: Diagnosis not present

## 2022-11-02 DIAGNOSIS — J069 Acute upper respiratory infection, unspecified: Secondary | ICD-10-CM | POA: Diagnosis present

## 2022-11-02 DIAGNOSIS — Z79899 Other long term (current) drug therapy: Secondary | ICD-10-CM | POA: Insufficient documentation

## 2022-11-02 DIAGNOSIS — R058 Other specified cough: Secondary | ICD-10-CM | POA: Insufficient documentation

## 2022-11-02 DIAGNOSIS — Z1152 Encounter for screening for COVID-19: Secondary | ICD-10-CM | POA: Insufficient documentation

## 2022-11-02 DIAGNOSIS — J029 Acute pharyngitis, unspecified: Secondary | ICD-10-CM | POA: Diagnosis not present

## 2022-11-02 LAB — POCT RAPID STREP A (OFFICE): Rapid Strep A Screen: NEGATIVE

## 2022-11-02 MED ORDER — PREDNISONE 20 MG PO TABS: ORAL_TABLET | ORAL | 0 refills | Status: DC

## 2022-11-02 NOTE — ED Triage Notes (Signed)
Pt here today for sore throat x 3 days. Denies fever.

## 2022-11-02 NOTE — Discharge Instructions (Signed)
Take plain guaifenesin (1200mg  extended release tabs such as Mucinex) twice daily, with plenty of water, for cough and congestion.  May add Pseudoephedrine (30mg , one or two every 4 to 6 hours) for sinus congestion.  Get adequate rest.   May take Delsym Cough Suppressant ("12 Hour Cough Relief") at bedtime for nighttime cough.  Try warm salt water gargles for sore throat.  Stop all antihistamines for now, and other non-prescription cough/cold preparations. May take Tylenol as needed for headache, fever, etc.

## 2022-11-02 NOTE — ED Provider Notes (Signed)
[facility]    CSN: [id number] Arrival date & time: 11/02/22  0843      History   Chief Complaint Chief Complaint  Patient presents with   Sore Throat    HPI Nancy Lucas is a 46 y.o. female.   One week ago patient developed mild nasal congestion that has persisted.  Three days ago she developed a sore throat and minimal cough.  She does not feel ill, but is concerned because she has now developed hoarseness.  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  The history is provided by the patient.    Past Medical History:  Diagnosis Date   AMA (advanced maternal age) multigravida 35+    Hx of varicella    Hyperlipidemia    Seasonal allergies     Patient Active Problem List   Diagnosis Date Noted   Pregnancy 03/31/2016   Normal vaginal delivery 03/31/2016    Past Surgical History:  Procedure Laterality Date   AUGMENTATION MAMMAPLASTY     DILATION AND CURETTAGE OF UTERUS      OB History     Gravida  4   Para  3   Term  3   Preterm      AB      Living  3      SAB      IAB      Ectopic      Multiple  0   Live Births  3            Home Medications    Prior to Admission medications   Medication Sig Start Date End Date Taking? Authorizing Provider  CRESTOR 10 MG tablet Take 10 mg by mouth daily. 09/20/22  Yes [provider]  predniSONE (DELTASONE) 20 MG tablet Take one tab by mouth twice daily for 4 days, then one daily for 3 days. Take with food. 11/02/22  Yes Lattie Haw, MD  ferrous sulfate 325 (65 FE) MG tablet Take 1 tablet (325 mg total) by mouth 2 (two) times daily with a meal. 04/02/16   Essie Hart, MD  fexofenadine (ALLEGRA ALLERGY) 180 MG tablet Take 1 tablet (180 mg total) by mouth daily for 15 days. 08/19/21 09/03/21  Trevor Iha, FNP  ibuprofen (ADVIL,MOTRIN) 600 MG tablet Take 1 tablet (600 mg total) by mouth every 6  (six) hours. 04/02/16   Essie Hart, MD  levonorgestrel (MIRENA) 20 MCG/24HR IUD 1 each by Intrauterine route once.    [provider]  metroNIDAZOLE (FLAGYL) 500 MG tablet Take one tab by mouth every 12 hours for 7 days. 11/04/18   Arnaldo Natal, MD  omeprazole (PRILOSEC OTC) 20 MG tablet Take 20 mg by mouth daily.    [provider]  Prenatal Vit-Fe Fumarate-FA (PRENATAL MULTIVITAMIN) TABS tablet Take 1 tablet by mouth at bedtime.    [provider]    Family History Family History  Problem Relation Age of Onset   Hyperlipidemia Mother    Hyperlipidemia Maternal Grandfather     Social History Social History   Tobacco Use   Smoking status: Never   Smokeless tobacco: Never  Substance Use Topics   Alcohol use: Yes   Drug use: No     Allergies   Patient has no known allergies.   Review of Systems Review of Systems + sore throat + hoarseness + cough No pleuritic  pain No wheezing + nasal congestion + post-nasal drainage No sinus pain/pressure No itchy/red eyes No earache No hemoptysis No SOB No fever/chills No nausea No vomiting No abdominal pain No diarrhea No urinary symptoms No skin rash No fatigue No myalgias No headache   Physical Exam Triage Vital Signs ED Triage Vitals  Enc Vitals Group     BP 11/02/22 0910 124/79     Pulse --      Resp 11/02/22 0910 17     Temp 11/02/22 0910 97.9 F (36.6 C)     Temp Source 11/02/22 0910 Oral     SpO2 11/02/22 0910 99 %     Weight --      Height --      Head Circumference --      Peak Flow --      Pain Score 11/02/22 0908 1     Pain Loc --      Pain Edu? --      Excl. in GC? --    No data found.  Updated Vital Signs BP 124/79 (BP Location: Right Arm)   Temp 97.9 F (36.6 C) (Oral)   Resp 17   SpO2 99%   Visual Acuity Right Eye Distance:   Left Eye Distance:   Bilateral Distance:    Right Eye Near:   Left Eye Near:    Bilateral Near:     Physical  Exam Nursing notes and Vital Signs reviewed. Appearance:  Patient appears stated age, and in no acute distress Eyes:  Pupils are equal, round, and reactive to light and accomodation.  Extraocular movement is intact.  Conjunctivae are not inflamed  Ears:  Canals normal.  Tympanic membranes normal.  Nose:  Mildly congested turbinates.  No sinus tenderness. Pharynx: Mild erythema; small right tonsillar stone noted. Neck:  Supple.  Mildly enlarged left lateral nodes are present. Lungs:  Clear to auscultation.  Breath sounds are equal.  Moving air well. Heart:  Regular rate and rhythm without murmurs, rubs, or gallops.  Abdomen:  Nontender without masses or hepatosplenomegaly.  Bowel sounds are present.  No CVA or flank tenderness.  Extremities:  No edema.  Skin:  No rash present.   UC Treatments / Results  Labs (all labs ordered are listed, but only abnormal results are displayed) Labs Reviewed  SARS CORONAVIRUS 2 (TAT 6-24 HRS)  POCT RAPID STREP A (OFFICE) negative    EKG   Radiology No results found.  Procedures Procedures (including critical care time)  Medications Ordered in UC Medications - No data to display  Initial Impression / Assessment and Plan / UC Course  I have reviewed the triage vital signs and the nursing notes.  Pertinent labs & imaging results that were available during my care of the patient were reviewed by me and considered in my medical decision making (see chart for details).    COVID PCR pending. There is no evidence of bacterial infection today.   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 Followup with Family Doctor if not improved in about 10 days.  Final Clinical Impressions(s) / UC Diagnoses   Final diagnoses:  Viral upper respiratory tract infection     Discharge Instructions      Take plain guaifenesin (1200mg  extended release tabs such as Mucinex) twice daily, with plenty of  water, for cough and congestion.  May add Pseudoephedrine (30mg , one or two every 4 to 6 hours) for sinus congestion.  Get  adequate rest.   May take Delsym Cough Suppressant ("12 Hour Cough Relief") at bedtime for nighttime cough.  Try warm salt water gargles for sore throat.  Stop all antihistamines for now, and other non-prescription cough/cold preparations. May take Tylenol as needed for headache, fever, etc.    ED Prescriptions     Medication Sig Dispense Auth. Provider   predniSONE (DELTASONE) 20 MG tablet Take one tab by mouth twice daily for 4 days, then one daily for 3 days. Take with food. 11 tablet Lattie Haw, MD         Lattie Haw, MD 11/04/22 1350

## 2022-11-03 LAB — SARS CORONAVIRUS 2 (TAT 6-24 HRS): SARS Coronavirus 2: NEGATIVE

## 2022-11-16 ENCOUNTER — Encounter: Payer: Self-pay | Admitting: Nurse Practitioner

## 2022-11-16 ENCOUNTER — Ambulatory Visit: Payer: No Typology Code available for payment source | Admitting: Nurse Practitioner

## 2022-11-16 VITALS — BP 131/89 | HR 76 | Ht 60.75 in | Wt 150.2 lb

## 2022-11-16 DIAGNOSIS — R946 Abnormal results of thyroid function studies: Secondary | ICD-10-CM

## 2022-11-16 NOTE — Progress Notes (Addendum)
11/16/2022     Endocrinology Consult Note    Subjective:    Patient ID: Nancy Lucas, female    DOB: Apr 29, 1976, PCP Cox, Burman Foster, NP.   Past Medical History:  Diagnosis Date   AMA (advanced maternal age) multigravida 35+    Hx of varicella    Hyperlipidemia    Seasonal allergies     Past Surgical History:  Procedure Laterality Date   AUGMENTATION MAMMAPLASTY     DILATION AND CURETTAGE OF UTERUS      Social History   Socioeconomic History   Marital status: Married    Spouse name: Not on file   Number of children: Not on file   Years of education: Not on file   Highest education level: Not on file  Occupational History   Not on file  Tobacco Use   Smoking status: Never   Smokeless tobacco: Never  Substance and Sexual Activity   Alcohol use: Yes   Drug use: No   Sexual activity: Not on file  Other Topics Concern   Not on file  Social History Narrative   Not on file   Social Determinants of Health   Financial Resource Strain: Not on file  Food Insecurity: Not on file  Transportation Needs: Not on file  Physical Activity: Not on file  Stress: Not on file  Social Connections: Not on file    Family History  Problem Relation Age of Onset   Hyperlipidemia Mother    Hyperlipidemia Maternal Grandfather     Outpatient Encounter Medications as of 11/16/2022  Medication Sig   CRESTOR 10 MG tablet Take 10 mg by mouth daily.   FIBER ADULT GUMMIES PO Take by mouth.   Benefiber Prebiotics Patient states that she takes 2 in the morning and 2 at night.   ibuprofen (ADVIL,MOTRIN) 600 MG tablet Take 1 tablet (600 mg total) by mouth every 6 (six) hours.   Multiple Vitamins-Minerals (WOMENS ONE DAILY) TABS Take by mouth daily.   fexofenadine (ALLEGRA ALLERGY) 180 MG tablet Take 1 tablet (180 mg total) by mouth daily for 15 days.   levonorgestrel (MIRENA) 20 MCG/24HR IUD 1 each by Intrauterine route once. (Patient not taking: Reported on 11/16/2022)    [DISCONTINUED] ferrous sulfate 325 (65 FE) MG tablet Take 1 tablet (325 mg total) by mouth 2 (two) times daily with a meal. (Patient not taking: Reported on 11/16/2022)   [DISCONTINUED] metroNIDAZOLE (FLAGYL) 500 MG tablet Take one tab by mouth every 12 hours for 7 days. (Patient not taking: Reported on 11/16/2022)   [DISCONTINUED] omeprazole (PRILOSEC OTC) 20 MG tablet Take 20 mg by mouth daily. (Patient not taking: Reported on 11/16/2022)   [DISCONTINUED] predniSONE (DELTASONE) 20 MG tablet Take one tab by mouth twice daily for 4 days, then one daily for 3 days. Take with food. (Patient not taking: Reported on 11/16/2022)   [DISCONTINUED] Prenatal Vit-Fe Fumarate-FA (PRENATAL MULTIVITAMIN) TABS tablet Take 1 tablet by mouth at bedtime. (Patient not taking: Reported on 11/16/2022)   No facility-administered encounter medications on file as of 11/16/2022.    ALLERGIES: No Known Allergies  VACCINATION STATUS: There is no immunization history for the selected administration types on file for this patient.   HPI  Nancy Lucas is 47 y.o. female who presents today with a medical history as above. she is being seen in consultation for hyperthyroidism requested by Cox, Burman Foster, NP.  she has been dealing with symptoms of fatigue, weight gain, and irregular  menses for 3 months.  She initially went to her OBGYN for concerns that she may be going into perimenopause which was ruled out and additional thyroid labs were drawn.  She had antibody testing which was negative and essentially the only thyroid lab that was off was the T3 uptake. The labs were rechecked about a month later and had reverted back to normal.  She was sent for thyroid ultrasound and uptake and scan in the interim.  Ultrasound was normal, homogeneous echotexture, no nodules, normal sized thyroid gland.  Her uptake and scan was low with 4 hr uptake at 4% and 24 hr uptake at 6.9%.  she denies dysphagia, choking, shortness of breath, no recent voice  change. She did notice some neck tenderness originally which has improved in the last few weeks.   she does have family history of thyroid dysfunction in her sister, but denies family hx of thyroid cancer. she denies personal history of goiter. she is not on any anti-thyroid medications nor on any thyroid hormone supplements. She does take a womens formulated MVI with Biotin in it which can cause disruption of thyroid labs.   Review of systems  Constitutional: + steadily increasing body weight-per patient, current Body mass index is 28.61 kg/m., no fatigue, no subjective hyperthermia, no subjective hypothermia Eyes: no blurry vision, no xerophthalmia ENT: no sore throat, no nodules palpated in throat, no dysphagia/odynophagia, no hoarseness Cardiovascular: no chest pain, no shortness of breath, no palpitations, no leg swelling Respiratory: no cough, no shortness of breath Gastrointestinal: no nausea/vomiting/diarrhea Genitourinary: + irregular menses Musculoskeletal: no muscle/joint aches Skin: no rashes, no hyperemia Neurological: no tremors, no numbness, no tingling, no dizziness Psychiatric: no depression, no anxiety, + insomnia   Objective:    BP 131/89 (BP Location: Left Arm, Patient Position: Sitting, Cuff Size: Normal)   Pulse 76   Ht 5' 0.75" (1.543 m)   Wt 150 lb 3.2 oz (68.1 kg)   BMI 28.61 kg/m   Wt Readings from Last 3 Encounters:  11/16/22 150 lb 3.2 oz (68.1 kg)  05/19/18 140 lb (63.5 kg)  05/28/16 150 lb 4 oz (68.2 kg)     BP Readings from Last 3 Encounters:  11/16/22 131/89  11/02/22 124/79  08/19/21 126/84                          Physical Exam- Limited  Constitutional:  Body mass index is 28.61 kg/m. , not in acute distress, normal state of mind Eyes:  EOMI, no exophthalmos Neck: Supple Thyroid: No gross goiter, no palpable nodularity, thyroid area firm Cardiovascular: RRR, no murmurs, rubs, or gallops, no edema Respiratory: Adequate breathing  efforts, no crackles, rales, rhonchi, or wheezing Musculoskeletal: no gross deformities, strength intact in all four extremities, no gross restriction of joint movements Skin:  no rashes, no hyperemia Neurological: + slight tremor with outstretched hands, DTR normal in BLE   CMP  No results found for: "NA", "K", "CL", "CO2", "GLUCOSE", "BUN", "CREATININE", "CALCIUM", "PROT", "ALBUMIN", "AST", "ALT", "ALKPHOS", "BILITOT", "GFRNONAA", "GFRAA"   CBC    Component Value Date/Time   WBC 12.4 (H) 04/01/2016 0519   RBC 2.88 (L) 04/01/2016 0519   HGB 7.9 (L) 04/01/2016 0519   HCT 24.4 (L) 04/01/2016 0519   PLT 177 04/01/2016 0519   MCV 84.7 04/01/2016 0519   MCH 27.4 04/01/2016 0519   MCHC 32.4 04/01/2016 0519   RDW 13.4 04/01/2016 0519     Diabetic Labs (most  recent): No results found for: "HGBA1C", "MICROALBUR"  Lipid Panel  No results found for: "CHOL", "TRIG", "HDL", "CHOLHDL", "VLDL", "LDLCALC", "LDLDIRECT", "LABVLDL"   No results found for: "TSH", "FREET4"   Uptake and Scan from 09/14/22 NM Thyroid Uptake And Scan  Anatomical Region Laterality Modality  Neck -- Nuclear Medicine   Impression  IMPRESSION: 1. Low uptakes as above  Electronically Signed by: Caryl Never, MD on 09/14/2022 9:21 PM Narrative  This result has an attachment that is not available. INDICATION: Abnormal results of thyroid function studies  TECHNIQUE: NM THYROID UPTAKE AND SCAN. 338 uCi of I-123 was administered. Exam date/time: 09/13/2022 6:56 AM.  Comparison none.  FINDINGS: Uptake images are normal. There is no hot or cold nodule identified.  Uptake at 4 hours is 4%. Normal is 5-15%.  Uptake at 24 hours is 6.9%. Normal range is 15-30% Procedure Note  Caryl Never, MD - 09/14/2022 Formatting of this note might be different from the original. INDICATION: Abnormal results of thyroid function studies  TECHNIQUE: NM THYROID UPTAKE AND SCAN. 338 uCi of I-123 was administered. Exam  date/time: 09/13/2022 6:56 AM.  Comparison none.  FINDINGS: Uptake images are normal. There is no hot or cold nodule identified.  Uptake at 4 hours is 4%. Normal is 5-15%.  Uptake at 24 hours is 6.9%. Normal range is 15-30%   IMPRESSION: 1. Low uptakes as above  Electronically Signed by: Caryl Never, MD on 09/14/2022 9:21 PM ----------------------------------------------------------------------------------------------------------------- Thyroid Ultrasound from 09/11/22 US Thyroid  Anatomical Region Laterality Modality  Head -- Ultrasound   Impression  IMPRESSION: No significant finding.   ACR TI-RADS recommendations: TR5 (7 or more points) - FNA if greater than or equal to 1cm; if 0.5 - 0.9 cm follow-up every year for 5 years. TR4 (4-6 points) - FNA if greater than or equal to 1.5cm; if 1 - 1.4 cm follow-up in 1, 2, 3 and 5 years. TR3 (3 points) - FNA if greater than or equal to 2.5cm;  if 1.5 - 2.4 cm follow-up in 1, 3 and 5 years. TR2 (2 points) and TR1 (0 points) - No FNA or follow-up.  # ACR TI-RADS recommends that no more than two nodules with the highest ACR TI-RADS total point should be biopsied and no more than four nodules should be followed.  Electronically Signed by: Dorthula Perfect, MD on 09/12/2022 1:51 PM Narrative  This result has an attachment that is not available. THYROID ULTRASOUND  INDICATION: Abnormal results of thyroid function studies  COMPARISON: No comparison  TECHNIQUE: Gray-scale and color Doppler images of the thyroid gland were obtained.  FINDINGS:  ISTHMUS: - Size: 0.2 cm.  RIGHT LOBE: - Size: 3.9 x 1.4 x 1.4 cm. - Echogenicity: Normal.  LEFT LOBE: - Size: 3.9 x 1.4 x 1.1 cm. - Echogenicity: Normal.  NODULES: No nodules Procedure Note  Alonna Minium, MD - 09/12/2022 Formatting of this note might be different from the original. THYROID ULTRASOUND  INDICATION: Abnormal results of thyroid function studies  COMPARISON: No  comparison  TECHNIQUE: Gray-scale and color Doppler images of the thyroid gland were obtained.  FINDINGS:  ISTHMUS: - Size: 0.2 cm.  RIGHT LOBE: - Size: 3.9 x 1.4 x 1.4 cm. - Echogenicity: Normal.  LEFT LOBE: - Size: 3.9 x 1.4 x 1.1 cm. - Echogenicity: Normal.  NODULES: No nodules   IMPRESSION: No significant finding.   ACR TI-RADS recommendations: TR5 (7 or more points) - FNA if greater than or equal to 1cm; if 0.5 - 0.9  cm follow-up every year for 5 years. TR4 (4-6 points) - FNA if greater than or equal to 1.5cm; if 1 - 1.4 cm follow-up in 1, 2, 3 and 5 years. TR3 (3 points) - FNA if greater than or equal to 2.5cm;  if 1.5 - 2.4 cm follow-up in 1, 3 and 5 years. TR2 (2 points) and TR1 (0 points) - No FNA or follow-up.  # ACR TI-RADS recommends that no more than two nodules with the highest ACR TI-RADS total point should be biopsied and no more than four nodules should be followed.  Electronically Signed by: Dorthula Perfect, MD on 09/12/2022 1:51 PM Assessment & Plan:   1. Abnormal thyroid function test  she is being seen at a kind request of Cox, Erica A, NP.  her history and most recent labs are reviewed, and she was examined clinically. Subjective and objective findings are inconsistent with thyrotoxicosis from primary hyperthyroidism. However, the potential risks of untreated thyrotoxicosis and the need for definitive therapy have been discussed in detail with her, and she agrees to proceed with diagnostic workup and treatment plan.  Her thyroid antibody testing was negative, ruling out genetic predisposition to development of chronic thyroid conditions.   Her thyroid ultrasound was normal and her uptake and scan was low at 4 and 24 hr intervals which point more towards acute inflammation of the thyroid gland.   In acute thyroiditis, most often the thyroid will return to normal function with time without any intervention necessary.  The cause of acute thyroiditis is  unknown but can be triggered by a common cold or viral illness.  I will repeat full profile thyroid function tests today to gather more information.   She was concerned about her weight and I did give her some information on WFPB diet and lifestyle changes.  This type of diet may help reduce inflammation within the body and help with some of the symptoms she is experiencing.  The following Lifestyle Medicine recommendations according to [facility]) were discussed and offered to patient and she agrees to start the journey:  A. Whole Foods, Plant-based plate comprising of fruits and vegetables, plant-based proteins, whole-grain carbohydrates was discussed in detail with the patient.   A list for source of those nutrients were also provided to the patient.  Patient will use only water or unsweetened tea for hydration. B.  The need to stay away from risky substances including alcohol, smoking; obtaining 7 to 9 hours of restorative sleep, at least 150 minutes of moderate intensity exercise weekly, the importance of healthy social connections,  and stress reduction techniques were discussed. C.  A full color page of  Calorie density of various food groups per pound showing examples of each food groups was provided to the patient.    -Patient is advised to maintain close follow up with Cox, Fredric Dine, NP for primary care needs.   - Time spent with the patient: 60 minutes, of which >50% was spent in obtaining information about her symptoms, reviewing her previous labs, evaluations, and treatments, counseling her about her hyperthyroidism , and developing a plan to confirm the diagnosis and long term treatment as necessary. Please refer to "Patient Self Inventory" in the Media tab for reviewed elements of pertinent patient history.  Denissa B Trautner participated in the discussions, expressed understanding, and voiced agreement with the above plans.  All questions were answered  to her satisfaction. she is encouraged to contact clinic should she have any  questions or concerns prior to her return visit.   Follow up plan: Return in about 2 weeks (around 11/30/2022) for Thyroid follow up, Previsit labs.   Thank you for involving me in the care of this pleasant patient, and I will continue to update you with her progress.    Ronny Bacon, [facility] [address] [place], [place] [address] Phone: [phone] Fax: [phone]  11/16/2022, 10:24 AM

## 2022-11-23 ENCOUNTER — Encounter: Payer: Self-pay | Admitting: Nurse Practitioner

## 2022-11-23 LAB — T4, FREE: Free T4: 1.06 ng/dL (ref 0.82–1.77)

## 2022-11-23 LAB — T3, FREE: T3, Free: 2.4 pg/mL (ref 2.0–4.4)

## 2022-11-23 LAB — TSH: TSH: 0.604 u[IU]/mL (ref 0.450–4.500)

## 2022-11-23 NOTE — Telephone Encounter (Signed)
We can cancel her follow up appt.  I went over lab results via [facility].

## 2022-11-23 NOTE — Progress Notes (Signed)
Noted  

## 2022-11-29 ENCOUNTER — Ambulatory Visit: Payer: No Typology Code available for payment source | Admitting: Nurse Practitioner

## 2023-05-04 ENCOUNTER — Other Ambulatory Visit: Payer: Self-pay | Admitting: Obstetrics

## 2023-05-04 DIAGNOSIS — Z1231 Encounter for screening mammogram for malignant neoplasm of breast: Secondary | ICD-10-CM

## 2023-06-22 ENCOUNTER — Ambulatory Visit: Payer: No Typology Code available for payment source

## 2023-06-29 ENCOUNTER — Ambulatory Visit: Admission: RE | Admit: 2023-06-29 | Payer: 59 | Source: Ambulatory Visit

## 2023-06-29 DIAGNOSIS — Z1231 Encounter for screening mammogram for malignant neoplasm of breast: Secondary | ICD-10-CM

## 2023-07-19 ENCOUNTER — Other Ambulatory Visit: Payer: Self-pay | Admitting: Oncology

## 2023-07-19 DIAGNOSIS — Z006 Encounter for examination for normal comparison and control in clinical research program: Secondary | ICD-10-CM

## 2024-04-24 ENCOUNTER — Other Ambulatory Visit: Payer: Self-pay | Admitting: Obstetrics

## 2024-04-24 DIAGNOSIS — Z1231 Encounter for screening mammogram for malignant neoplasm of breast: Secondary | ICD-10-CM

## 2024-06-30 ENCOUNTER — Ambulatory Visit
Admission: RE | Admit: 2024-06-30 | Discharge: 2024-06-30 | Disposition: A | Discharge status: Home / Self Care | Source: Ambulatory Visit | Attending: Obstetrics | Admitting: Obstetrics

## 2024-06-30 DIAGNOSIS — Z1231 Encounter for screening mammogram for malignant neoplasm of breast: Secondary | ICD-10-CM

## 2024-09-15 ENCOUNTER — Other Ambulatory Visit: Payer: Self-pay | Admitting: Medical Genetics

## 2024-09-15 DIAGNOSIS — Z006 Encounter for examination for normal comparison and control in clinical research program: Secondary | ICD-10-CM

## 2024-10-30 LAB — GENECONNECT MOLECULAR SCREEN: Genetic Analysis Overall Interpretation: POSITIVE — AB

## 2024-10-31 ENCOUNTER — Telehealth: Payer: Self-pay | Admitting: Medical Genetics

## 2024-10-31 DIAGNOSIS — E78019 Familial hypercholesterolemia, unspecified: Secondary | ICD-10-CM | POA: Insufficient documentation

## 2024-10-31 NOTE — Telephone Encounter (Signed)
  [facility] Positive Result Note 10/31/2024 11:08 AM  FIRST ATTEMPT: Confirmed I was speaking with Nancy Lucas [id number] by using name and DOB. Informed participant the reason for this call is to provide results for the above study. 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 Genetic counseling was offered and participant declined. All questions were answered, and participant was thanked for their time and support of the above study. Participant was encouraged to contact [facility] if they have any further questions or concerns.
